# Patient Record
Sex: Female | Born: 1990 | Hispanic: No | Marital: Married | Smoking: Never smoker
Health system: Southern US, Community
[De-identification: ages and names within clinical notes are randomized; demographics above are authoritative.]

## PROBLEM LIST (undated history)

## (undated) ENCOUNTER — Inpatient Hospital Stay (HOSPITAL_COMMUNITY): Payer: Self-pay

## (undated) DIAGNOSIS — Z789 Other specified health status: Secondary | ICD-10-CM

## (undated) HISTORY — PX: NO PAST SURGERIES: SHX2092

---

## 2013-07-15 ENCOUNTER — Other Ambulatory Visit: Payer: Self-pay

## 2013-07-17 ENCOUNTER — Encounter: Payer: Self-pay | Admitting: Family Medicine

## 2013-07-17 ENCOUNTER — Other Ambulatory Visit (INDEPENDENT_AMBULATORY_CARE_PROVIDER_SITE_OTHER): Payer: Self-pay

## 2013-08-12 ENCOUNTER — Ambulatory Visit (INDEPENDENT_AMBULATORY_CARE_PROVIDER_SITE_OTHER): Payer: Medicaid Other | Admitting: Advanced Practice Midwife

## 2013-08-12 ENCOUNTER — Inpatient Hospital Stay (HOSPITAL_COMMUNITY)
Admission: AD | Admit: 2013-08-12 | Discharge: 2013-08-12 | Disposition: A | Discharge status: Home / Self Care | Payer: Medicaid Other | Source: Ambulatory Visit | Attending: Family Medicine | Admitting: Family Medicine

## 2013-08-12 ENCOUNTER — Encounter (HOSPITAL_COMMUNITY): Payer: Self-pay | Admitting: *Deleted

## 2013-08-12 ENCOUNTER — Encounter: Payer: Self-pay | Admitting: Advanced Practice Midwife

## 2013-08-12 ENCOUNTER — Ambulatory Visit (HOSPITAL_COMMUNITY)
Admission: RE | Admit: 2013-08-12 | Discharge: 2013-08-12 | Disposition: A | Discharge status: Home / Self Care | Payer: Medicaid Other | Source: Ambulatory Visit | Attending: Advanced Practice Midwife | Admitting: Advanced Practice Midwife

## 2013-08-12 ENCOUNTER — Other Ambulatory Visit (HOSPITAL_COMMUNITY): Payer: Self-pay | Admitting: *Deleted

## 2013-08-12 ENCOUNTER — Other Ambulatory Visit (HOSPITAL_COMMUNITY)
Admission: RE | Admit: 2013-08-12 | Discharge: 2013-08-12 | Disposition: A | Discharge status: Home / Self Care | Payer: Medicaid Other | Source: Ambulatory Visit | Attending: Advanced Practice Midwife | Admitting: Advanced Practice Midwife

## 2013-08-12 VITALS — BP 110/69 | Temp 97.7°F | Ht 62.0 in | Wt 117.4 lb

## 2013-08-12 DIAGNOSIS — O368390 Maternal care for abnormalities of the fetal heart rate or rhythm, unspecified trimester, not applicable or unspecified: Secondary | ICD-10-CM | POA: Insufficient documentation

## 2013-08-12 DIAGNOSIS — Z3400 Encounter for supervision of normal first pregnancy, unspecified trimester: Secondary | ICD-10-CM

## 2013-08-12 DIAGNOSIS — Z113 Encounter for screening for infections with a predominantly sexual mode of transmission: Secondary | ICD-10-CM | POA: Insufficient documentation

## 2013-08-12 DIAGNOSIS — Z3689 Encounter for other specified antenatal screening: Secondary | ICD-10-CM | POA: Insufficient documentation

## 2013-08-12 DIAGNOSIS — O3680X0 Pregnancy with inconclusive fetal viability, not applicable or unspecified: Secondary | ICD-10-CM | POA: Insufficient documentation

## 2013-08-12 DIAGNOSIS — O200 Threatened abortion: Secondary | ICD-10-CM

## 2013-08-12 DIAGNOSIS — Z01419 Encounter for gynecological examination (general) (routine) without abnormal findings: Secondary | ICD-10-CM | POA: Insufficient documentation

## 2013-08-12 DIAGNOSIS — O99891 Other specified diseases and conditions complicating pregnancy: Secondary | ICD-10-CM

## 2013-08-12 HISTORY — DX: Other specified health status: Z78.9

## 2013-08-12 LAB — POCT URINALYSIS DIP (DEVICE)
Ketones, ur: NEGATIVE mg/dL
Leukocytes, UA: NEGATIVE
Protein, ur: NEGATIVE mg/dL
pH: 6 (ref 5.0–8.0)

## 2013-08-12 LAB — HIV ANTIBODY (ROUTINE TESTING W REFLEX): HIV: NONREACTIVE

## 2013-08-12 MED ORDER — IBUPROFEN 800 MG PO TABS: 800.0000 mg | ORAL_TABLET | Freq: Three times a day (TID) | ORAL | Status: DC

## 2013-08-12 NOTE — Progress Notes (Signed)
Pulse- 97  Pain-on the sides at night Weight gain 25-35lbs New ob packet given

## 2013-08-12 NOTE — MAU Note (Signed)
Pt had routine appt in clinic with u/s, unable to obtain doppler FHT's, denies any bleeding or cramping.  Was sent to MAU from ultrasound. Thinks she is 11wks by LMP.

## 2013-08-12 NOTE — MAU Note (Signed)
Discussed results of u/s, expectant management and options if fetus does not pass on its own.  Called pt husband to be with her. Pt very tearful.

## 2013-08-12 NOTE — MAU Provider Note (Signed)
  History     CSN: [id number]  Arrival date and time: 08/12/13 1106   First Provider Initiated Contact with Patient 08/12/13 1235      No chief complaint on file.  HPI Comments: Wendy Ball 22 y.o. G1P0 was in Clinic today an at 9 weeks and 6 days pregnant when the nurses were unable to hear a fetal heart beat. She was sent to ultrasound and there was no cardiac activity visualized. CRL was 6 w 2d. The recommendations were to repeat ultrasound in 7-10 days.         Past Medical History  Diagnosis Date  . Medical history non-contributory     Past Surgical History  Procedure Laterality Date  . No past surgeries      History reviewed. No pertinent family history.  History  Substance Use Topics  . Smoking status: Never Smoker   . Smokeless tobacco: Never Used  . Alcohol Use: No    Allergies: No Known Allergies  No prescriptions prior to admission    Review of Systems  Constitutional: Negative.   HENT: Negative.   Eyes: Negative.   Respiratory: Negative.   Cardiovascular: Negative.   Gastrointestinal: Negative.   Genitourinary: Negative.   Musculoskeletal: Negative.   Skin: Negative.   Neurological: Negative.   Psychiatric/Behavioral: Negative.    Physical Exam   Blood pressure 103/67, pulse 83, temperature 97.6 F (36.4 C), temperature source Oral, resp. rate 16, last menstrual period 06/04/2013.  Physical Exam  Constitutional: She appears well-developed and well-nourished.  Cardiovascular: Normal rate.   Respiratory: Effort normal.  GI: Soft.  Genitourinary: Vagina normal and uterus normal. No vaginal discharge found.  Cervix is long and closed. Bimanual exam is negative. Uterus is nontender and difficult to determine size due to her position    MAU Course  Procedures  MDM U/S complete less than 14 weeks Spoke with Dr Shawnie Pons  Assessment and Plan  Pt given miscarriage precautions Repeat U/S in one week for size=dates and viability Motrin 800  mg po TID for pain in event of miscarriage only  Carolynn Serve 08/12/2013, 1:23 PM

## 2013-08-12 NOTE — Progress Notes (Signed)
   Subjective:    Wendy Ball is a G1P0 [redacted]w[redacted]d being seen today for her first obstetrical visit.  Her obstetrical history is significant for none. Patient does intend to breast feed. Pregnancy history fully reviewed.  Patient reports Some inguinal pain when lying in bed to sleep.  Ceasar Mons Vitals:   08/12/13 0847 08/12/13 0850  BP: 110/69   Temp: 97.7 F (36.5 C)   Height:  5\' 2"  (1.575 m)  Weight: 117 lb 6.4 oz (53.252 kg)     HISTORY: OB History  Gravida Para Term Preterm AB SAB TAB Ectopic Multiple Living  1             # Outcome Date GA Lbr Len/2nd Weight Sex Delivery Anes PTL Lv  1 CUR              History reviewed. No pertinent past medical history. History reviewed. No pertinent past surgical history. History reviewed. No pertinent family history.   Exam    Uterus:     Pelvic Exam:    Perineum: No Hemorrhoids, Normal Perineum   Vulva: normal   Vagina:  normal mucosa, normal discharge   pH:    Cervix: no cervical motion tenderness, no lesions and nulliparous appearance   Adnexa: normal adnexa and no mass, fullness, tenderness   Bony Pelvis: average  System: Breast:  normal appearance, no masses or tenderness   Skin: normal coloration and turgor, no rashes    Neurologic: oriented, normal, gait normal; reflexes normal and symmetric   Extremities: normal strength, tone, and muscle mass, ROM of all joints is normal   HEENT neck supple with midline trachea and thyroid without masses   Mouth/Teeth mucous membranes moist, pharynx normal without lesions and dental hygiene good   Neck supple   Cardiovascular: regular rate and rhythm   Respiratory:  appears well, vitals normal, no respiratory distress, acyanotic, normal RR, ear and throat exam is normal, neck free of mass or lymphadenopathy, chest clear, no wheezing, crepitations, rhonchi, normal symmetric air entry   Abdomen: soft, non-tender; bowel sounds normal; no masses,  no organomegaly   Urinary: urethral  meatus normal      Assessment:    Pregnancy: G1P0 Patient Active Problem List   Diagnosis Date Noted  . Supervision of normal first pregnancy 08/12/2013        Plan:     Initial labs drawn. Prenatal vitamins. Problem list reviewed and updated. Genetic Screening discussed First Screen: undecided.  Ultrasound discussed; fetal survey: requested.  Follow up in 4 weeks. 50% of 30 min visit spent on counseling and coordination of care.     LEFTWICH-KIRBY, Kasyn Rolph 08/12/2013

## 2013-08-12 NOTE — MAU Provider Note (Signed)
Chart reviewed and agree with management and plan.  

## 2013-08-13 LAB — OBSTETRIC PANEL
Antibody Screen: NEGATIVE
Eosinophils Absolute: 0.6 10*3/uL (ref 0.0–0.7)
Eosinophils Relative: 7 % — ABNORMAL HIGH (ref 0–5)
HCT: 36.1 % (ref 36.0–46.0)
Hemoglobin: 12.6 g/dL (ref 12.0–15.0)
Lymphocytes Relative: 17 % (ref 12–46)
Lymphs Abs: 1.5 10*3/uL (ref 0.7–4.0)
MCH: 30.4 pg (ref 26.0–34.0)
MCV: 87 fL (ref 78.0–100.0)
Monocytes Absolute: 0.6 10*3/uL (ref 0.1–1.0)
Monocytes Relative: 7 % (ref 3–12)
Platelets: 263 10*3/uL (ref 150–400)
RBC: 4.15 MIL/uL (ref 3.87–5.11)
Rh Type: POSITIVE
Rubella: 4.96 Index — ABNORMAL HIGH (ref <0.90)
WBC: 8.7 10*3/uL (ref 4.0–10.5)

## 2013-08-14 LAB — HEMOGLOBINOPATHY EVALUATION
Hgb A: 97.2 % (ref 96.8–97.8)
Hgb S Quant: 0 %

## 2013-08-20 ENCOUNTER — Inpatient Hospital Stay (HOSPITAL_COMMUNITY)
Admission: AD | Admit: 2013-08-20 | Discharge: 2013-08-21 | Disposition: A | Discharge status: Home / Self Care | Payer: Medicaid Other | Source: Ambulatory Visit | Attending: Obstetrics & Gynecology | Admitting: Obstetrics & Gynecology

## 2013-08-20 ENCOUNTER — Other Ambulatory Visit (HOSPITAL_COMMUNITY): Payer: Self-pay | Admitting: Family Medicine

## 2013-08-20 ENCOUNTER — Inpatient Hospital Stay (HOSPITAL_COMMUNITY)
Admission: AD | Admit: 2013-08-20 | Discharge: 2013-08-20 | Disposition: A | Discharge status: Home / Self Care | Payer: Medicaid Other | Source: Ambulatory Visit | Attending: Obstetrics & Gynecology | Admitting: Obstetrics & Gynecology

## 2013-08-20 ENCOUNTER — Ambulatory Visit (HOSPITAL_COMMUNITY)
Admit: 2013-08-20 | Discharge: 2013-08-20 | Disposition: A | Discharge status: Home / Self Care | Payer: Medicaid Other | Attending: Family Medicine | Admitting: Family Medicine

## 2013-08-20 ENCOUNTER — Encounter (HOSPITAL_COMMUNITY): Payer: Self-pay

## 2013-08-20 DIAGNOSIS — Z3689 Encounter for other specified antenatal screening: Secondary | ICD-10-CM | POA: Insufficient documentation

## 2013-08-20 DIAGNOSIS — O034 Incomplete spontaneous abortion without complication: Secondary | ICD-10-CM

## 2013-08-20 DIAGNOSIS — O365990 Maternal care for other known or suspected poor fetal growth, unspecified trimester, not applicable or unspecified: Secondary | ICD-10-CM | POA: Insufficient documentation

## 2013-08-20 DIAGNOSIS — N9489 Other specified conditions associated with female genital organs and menstrual cycle: Secondary | ICD-10-CM

## 2013-08-20 DIAGNOSIS — O3680X0 Pregnancy with inconclusive fetal viability, not applicable or unspecified: Secondary | ICD-10-CM

## 2013-08-20 DIAGNOSIS — R109 Unspecified abdominal pain: Secondary | ICD-10-CM | POA: Insufficient documentation

## 2013-08-20 DIAGNOSIS — O021 Missed abortion: Secondary | ICD-10-CM

## 2013-08-20 MED ORDER — PROMETHAZINE HCL 12.5 MG PO TABS: 12.5000 mg | ORAL_TABLET | Freq: Four times a day (QID) | ORAL | Status: DC | PRN

## 2013-08-20 MED ORDER — OXYCODONE-ACETAMINOPHEN 5-325 MG PO TABS: 1.0000 | ORAL_TABLET | ORAL | Status: DC | PRN

## 2013-08-20 MED ORDER — OXYCODONE-ACETAMINOPHEN 5-325 MG PO TABS: 1.0000 | ORAL_TABLET | Freq: Four times a day (QID) | ORAL | Status: DC | PRN

## 2013-08-20 MED ORDER — MISOPROSTOL 200 MCG PO TABS: 200.0000 ug | ORAL_TABLET | ORAL | Status: DC

## 2013-08-20 NOTE — MAU Provider Note (Signed)
Chief Complaint: Follow-up   None    SUBJECTIVE HPI: Wendy Ball is a 22 y.o. G1P0 at [redacted]w[redacted]d by LMP who presents forfollowup ultrasound. She was seen 1 week ago at 9 week 6 days by LMP and had ultrasound showing IUP with crown-rump length consistent with 6 weeks 2 days no cardiac activity. Denies vaginal bleeding or abdominal cramping. Bpos.   Past Medical History  Diagnosis Date  . Medical history non-contributory    OB History  Gravida Para Term Preterm AB SAB TAB Ectopic Multiple Living  1             # Outcome Date GA Lbr Len/2nd Weight Sex Delivery Anes PTL Lv  1 CUR              Past Surgical History  Procedure Laterality Date  . No past surgeries     History   Social History  . Marital Status: Married    Spouse Name: N/A    Number of Children: N/A  . Years of Education: N/A   Occupational History  . Not on file.   Social History Main Topics  . Smoking status: Never Smoker   . Smokeless tobacco: Never Used  . Alcohol Use: No  . Drug Use: No  . Sexual Activity: Yes     Comment: last intercourse one week ago   Other Topics Concern  . Not on file   Social History Narrative  . No narrative on file   No current facility-administered medications on file prior to encounter.   Current Outpatient Prescriptions on File Prior to Encounter  Medication Sig Dispense Refill  . ibuprofen (ADVIL,MOTRIN) 800 MG tablet Take 1 tablet (800 mg total) by mouth 3 (three) times daily.  21 tablet  0   No Known Allergies  ROS: Pertinent items in HPI  OBJECTIVE Blood pressure 95/54, pulse 86, temperature 98 F (36.7 C), temperature source Oral, resp. rate 16, last menstrual period 06/04/2013, SpO2 100.00%. GENERAL: Well-developed, well-nourished female in no acute distress.  ABDOMEN: Soft, non-tender  US Ob Transvaginal  08/20/2013   *RADIOLOGY REPORT*  Clinical Data: Size less than dates  TRANSVAGINAL OBSTETRIC US  Technique:  Transvaginal ultrasound was performed for  complete evaluation of the gestation as well as the maternal uterus, adnexal regions, and pelvic cul-de-sac.  Comparison:  08/12/2013  Intrauterine gestational sac: Visualized/irregular Yolk sac: Seen Embryo: Seen Cardiac Activity: Not seen Heart Rate: Not visualized  CRL: 4.6 mm           6   w  2   d        [place] EDC: 04/13/2014  Maternal uterus/adnexae: Both ovaries have a normal appearance with the right ovary measuring 2.4 x 2.4 x 1.7 cm and the left ovary measuring 1.6 x 1.5 x 1.7 cm.  No subchorionic hemorrhage, pelvic fluid or separate adnexal masses are seen.  IMPRESSION: Single intrauterine pregnancy demonstrating an estimated gestational age by crown-rump length of 6 weeks 2 days.  No interval growth has occurred of the fetal pole since the previous exam on 08/12/2013  which demonstrated a fetal pole measuring 5.6 mm and gestational age of 51w 2d. No interval development of fetal cardiac activity has occurred.  Findings today are confirmatory for pregnancy failure.  Normal ovaries   Original Report Authenticated By: Rhodia Albright, M.D.    MAU COURSE  ASSESSMENT 1. Embryonic demise     PLAN Offered optioins expectant managemnt, cytotec> couple elects cytotec Discharge home with bleeding precautions  Medication List    ASK your doctor about these medications       ibuprofen 800 MG tablet  Commonly known as:  ADVIL,MOTRIN  Take 1 tablet (800 mg total) by mouth 3 (three) times daily.            Early Intrauterine Pregnancy Failure  _x__  Documented intrauterine pregnancy failure less than or equal to [redacted] weeks gestation  _x_  No serious current illness  _x__  Baseline Hgb greater than or equal to 10g/dl  (29.5 )  _x__  Patient has easily accessible transportation to the hospital  _x__  Clear preference  _x__  Practitioner/physician deems patient reliable  _x__  Counseling by practitioner or physician  _x__  Patient education by RN  __x_  Consent form signed  _NI__   Rho-Gam given by RN if indicated  _x__ Medication dispensed   _x__   Cytotec 800 mcg  x_   Intravaginally by patient at home         __   Intravaginally by RN in MAU        __   Rectally by patient at home        __   Rectally by RN in MAU  _x__  Ibuprofen 600 mg 1 tablet by mouth every 6 hours as needed #30  _x__  Hydrocodone/acetaminophen 5/325 mg by mouth every 4 to 6 hours as needed  _x__  Phenergan 12.5 mg by mouth every 4 hours as needed for nausea   Follow-up Information   Follow up with [facility] In 2 weeks. (Someone from Clinic will call you with appointment)    Specialty:  Obstetrics and Gynecology   Contact information:   [address] [place] [address] [phone]      Danae Orleans, CNM 08/20/2013  10:26 AM

## 2013-08-20 NOTE — MAU Note (Signed)
Patient to MAU after follow up ultrasound. Patient denies bleeding but does have mild right abdominal pain off and on.

## 2013-08-20 NOTE — MAU Note (Addendum)
PT SAYS  SHE WAS HERE AT 0800-  HOME 1130.    TODAY CAME FOR FOLLOW-UP- ON U/S   NO FHR.   SO THEY SENT HER HOME WITH CYTOTEC.     SHE  INSERTED CYTOTEC AT 8PM AT HOME.   PAIN STARTED AT 830PM - ON RIGHT SIDE  THEN  AT 1030 PAIN  SWITCHED TO LEFT SIDE.   NO BLEEDING.     TOLD TO PICK UP RX-  THEY DID - WAS IBUPROFEN-     TOOK  800MG  AT 815PM.  .   NOW IN MAU- PAIN IS STILL ON LEFT SIDE.     SAYS PAIN IS SLIGHT LESS THAN  AT HOME    I CALLED [facility] ON  [address]-  SPOKE WITH PHARMACIST-   SHE SAID THEY PICKED  UP RX FOR CYTOTEC, IBUPROFEN, AND PHENERGAN-  BUT  NOTHING ON FILE ABOUT PERCOCET,   WHICH EPIC SAYS WAS PRINTED

## 2013-08-21 DIAGNOSIS — O034 Incomplete spontaneous abortion without complication: Secondary | ICD-10-CM

## 2013-08-21 MED ORDER — OXYCODONE-ACETAMINOPHEN 5-325 MG PO TABS
2.0000 | ORAL_TABLET | ORAL | Status: AC
  Administered 2013-08-21: 2 via ORAL
  Filled 2013-08-21: qty 2

## 2013-08-21 MED ORDER — OXYCODONE-ACETAMINOPHEN 5-325 MG PO TABS: 1.0000 | ORAL_TABLET | ORAL | Status: DC | PRN

## 2013-08-21 NOTE — MAU Provider Note (Signed)
Chief Complaint: Abdominal Pain   None    SUBJECTIVE HPI: Wendy Ball is a 22 y.o. G1P0 who presents to maternity admissions reporting abdominal pain.  She was prescribed Cytotec for a confirmed 6 week embryonic demise and placed the medication at 8 pm ~3 hours before her arrival in MAU.  She reports her pain is intermittent and severe, with pain in the R and L lower quadrants of her abdomen.  She picked up her prescription Ibuprofen and Phenergan but did not know her Percocet prescription was printed and did not obtain this medication.  She denies vaginal bleeding, vaginal itching/burning, urinary symptoms, h/a, dizziness, n/v, or fever/chills.    While in MAU, pt started to have vaginal bleeding, soaking 1/4 pad in 1 hour.    Past Medical History  Diagnosis Date  . Medical history non-contributory    Past Surgical History  Procedure Laterality Date  . No past surgeries     History   Social History  . Marital Status: Married    Spouse Name: N/A    Number of Children: N/A  . Years of Education: N/A   Occupational History  . Not on file.   Social History Main Topics  . Smoking status: Never Smoker   . Smokeless tobacco: Never Used  . Alcohol Use: No  . Drug Use: No  . Sexual Activity: Yes     Comment: last intercourse one week ago   Other Topics Concern  . Not on file   Social History Narrative  . No narrative on file   No current facility-administered medications on file prior to encounter.   Current Outpatient Prescriptions on File Prior to Encounter  Medication Sig Dispense Refill  . ibuprofen (ADVIL,MOTRIN) 800 MG tablet Take 1 tablet (800 mg total) by mouth 3 (three) times daily.  21 tablet  0  . misoprostol (CYTOTEC) 200 MCG tablet Take 1 tablet (200 mcg total) by mouth 1 day or 1 dose.  4 tablet  0  . oxyCODONE-acetaminophen (PERCOCET/ROXICET) 5-325 MG per tablet Take 1 tablet by mouth every 4 (four) hours as needed for pain.  10 tablet  0  .  oxyCODONE-acetaminophen (PERCOCET/ROXICET) 5-325 MG per tablet Take 1-2 tablets by mouth every 6 (six) hours as needed.  30 tablet  0  . promethazine (PHENERGAN) 12.5 MG tablet Take 1 tablet (12.5 mg total) by mouth every 6 (six) hours as needed for nausea.  30 tablet  0   No Known Allergies  ROS: Pertinent items in HPI  OBJECTIVE Blood pressure 92/50, pulse 78, last menstrual period 06/04/2013, unknown if currently breastfeeding. GENERAL: Well-developed, well-nourished female in no acute distress.  HEENT: Normocephalic HEART: normal rate RESP: normal effort ABDOMEN: Soft, non-tender EXTREMITIES: Nontender, no edema NEURO: Alert and oriented SPECULUM EXAM: Deferred  LAB RESULTS No results found for this or any previous visit (from the past 24 hour(s)).  IMAGING US Ob Comp Less 14 Wks  08/12/2013   OBSTETRICAL ULTRASOUND: This exam was performed within a [facility]. The OB US report was generated in the AS system, and faxed to the ordering physician.   This report is also available in [employer] and in the [employer]. See AS Obstetric US report.  US Ob Transvaginal  08/20/2013   *RADIOLOGY REPORT*  Clinical Data: Size less than dates  TRANSVAGINAL OBSTETRIC US  Technique:  Transvaginal ultrasound was performed for complete evaluation of the gestation as well as the maternal uterus, adnexal regions, and pelvic cul-de-sac.  Comparison:  08/12/2013  Intrauterine gestational sac: Visualized/irregular Yolk sac: Seen Embryo: Seen Cardiac Activity: Not seen Heart Rate: Not visualized  CRL: 4.6 mm           6   w  2   d        [place] EDC: 04/13/2014  Maternal uterus/adnexae: Both ovaries have a normal appearance with the right ovary measuring 2.4 x 2.4 x 1.7 cm and the left ovary measuring 1.6 x 1.5 x 1.7 cm.  No subchorionic hemorrhage, pelvic fluid or separate adnexal masses are seen.  IMPRESSION: Single intrauterine pregnancy demonstrating an estimated  gestational age by crown-rump length of 6 weeks 2 days.  No interval growth has occurred of the fetal pole since the previous exam on 08/12/2013  which demonstrated a fetal pole measuring 5.6 mm and gestational age of 44w 2d. No interval development of fetal cardiac activity has occurred.  Findings today are confirmatory for pregnancy failure.  Normal ovaries   Original Report Authenticated By: Rhodia Albright, M.D.   US Ob Transvaginal  08/12/2013   OBSTETRICAL ULTRASOUND: This exam was performed within a [facility] Department. The OB US report was generated in the AS system, and faxed to the ordering physician.   This report is also available in [employer] and in the [employer]. See AS Obstetric US report.   ASSESSMENT 1. Incomplete miscarriage   2. Uterine cramping     PLAN Percocet x2 given in MAU with significant improvement in symptoms Discharge home with bleeding precautions Printed new prescription for pt for Percocet x10 tabs F/U in [facility] as scheduled Return to MAU as needed   Sharen Counter Certified Nurse-Midwife 08/21/2013  12:21 AM

## 2013-08-26 ENCOUNTER — Encounter: Payer: Self-pay | Admitting: Obstetrics and Gynecology

## 2013-09-04 ENCOUNTER — Ambulatory Visit (INDEPENDENT_AMBULATORY_CARE_PROVIDER_SITE_OTHER): Payer: Medicaid Other | Admitting: Advanced Practice Midwife

## 2013-09-04 VITALS — BP 98/69 | HR 82 | Temp 98.8°F | Wt 115.0 lb

## 2013-09-04 DIAGNOSIS — O021 Missed abortion: Secondary | ICD-10-CM

## 2013-09-06 DIAGNOSIS — O021 Missed abortion: Secondary | ICD-10-CM | POA: Insufficient documentation

## 2013-09-06 NOTE — Patient Instructions (Signed)
Miscarriage A miscarriage is the sudden loss of an unborn baby (fetus) before the 20th week of pregnancy. Most miscarriages happen in the first 3 months of pregnancy. Sometimes, it happens before a woman even knows she is pregnant. A miscarriage is also called a "spontaneous miscarriage" or "early pregnancy loss." Having a miscarriage can be an emotional experience. Talk with your caregiver about any questions you may have about miscarrying, the grieving process, and your future pregnancy plans. CAUSES   Problems with the fetal chromosomes that make it impossible for the baby to develop normally. Problems with the baby's genes or chromosomes are most often the result of errors that occur, by chance, as the embryo divides and grows. The problems are not inherited from the parents.  Infection of the cervix or uterus.   Hormone problems.   Problems with the cervix, such as having an incompetent cervix. This is when the tissue in the cervix is not strong enough to hold the pregnancy.   Problems with the uterus, such as an abnormally shaped uterus, uterine fibroids, or congenital abnormalities.   Certain medical conditions.   Smoking, drinking alcohol, or taking illegal drugs.   Trauma.  Often, the cause of a miscarriage is unknown.  SYMPTOMS   Vaginal bleeding or spotting, with or without cramps or pain.  Pain or cramping in the abdomen or lower back.  Passing fluid, tissue, or blood clots from the vagina. DIAGNOSIS  Your caregiver will perform a physical exam. You may also have an ultrasound to confirm the miscarriage. Blood or urine tests may also be ordered. TREATMENT   Sometimes, treatment is not necessary if you naturally pass all the fetal tissue that was in the uterus. If some of the fetus or placenta remains in the body (incomplete miscarriage), tissue left behind may become infected and must be removed. Usually, a dilation and curettage (D and C) procedure is performed.  During a D and C procedure, the cervix is widened (dilated) and any remaining fetal or placental tissue is gently removed from the uterus.  Antibiotic medicines are prescribed if there is an infection. Other medicines may be given to reduce the size of the uterus (contract) if there is a lot of bleeding.  If you have Rh negative blood and your baby was Rh positive, you will need a Rh immunoglobulin shot. This shot will protect any future baby from having Rh blood problems in future pregnancies. HOME CARE INSTRUCTIONS   Your caregiver may order bed rest or may allow you to continue light activity. Resume activity as directed by your caregiver.  Have someone help with home and family responsibilities during this time.   Keep track of the number of sanitary pads you use each day and how soaked (saturated) they are. Write down this information.   Do not use tampons. Do not douche or have sexual intercourse until approved by your caregiver.   Only take over-the-counter or prescription medicines for pain or discomfort as directed by your caregiver.   Do not take aspirin. Aspirin can cause bleeding.   Keep all follow-up appointments with your caregiver.   If you or your partner have problems with grieving, talk to your caregiver or seek counseling to help cope with the pregnancy loss. Allow enough time to grieve before trying to get pregnant again.  SEEK IMMEDIATE MEDICAL CARE IF:   You have severe cramps or pain in your back or abdomen.  You have a fever.  You pass large blood clots (walnut-sized   or larger) ortissue from your vagina. Save any tissue for your caregiver to inspect.   Your bleeding increases.   You have a thick, bad-smelling vaginal discharge.  You become lightheaded, weak, or you faint.   You have chills.  MAKE SURE YOU:  Understand these instructions.  Will watch your condition.  Will get help right away if you are not doing well or get  worse. Document Released: 05/29/2001 Document Revised: 06/03/2012 Document Reviewed: 01/22/2012 [employer] Patient Information 2014 [employer].  

## 2013-09-06 NOTE — Progress Notes (Signed)
  Subjective:    Patient ID: Wendy Ball, female    DOB: 07-20-1991, 22 y.o.   MRN: [id number]  HPI This is a 22 y.o. female who is about 2 weeks post cytotec for a missed abortion.  She has done well since then. Had bleeding for a few days then stopped. No pain now.    Review of Systems  Constitutional: Negative for activity change and fatigue.  Cardiovascular: Negative for chest pain.  Genitourinary: Negative for vaginal bleeding, difficulty urinating and menstrual problem.  Neurological: Negative for dizziness.       Objective:   Physical Exam  Constitutional: She is oriented to person, place, and time. She appears well-developed and well-nourished. No distress.  Cardiovascular: Normal rate.   Pulmonary/Chest: Effort normal.  Abdominal: Soft. She exhibits no distension and no mass. There is no tenderness. There is no rebound and no guarding.  Genitourinary: Vagina normal and uterus normal. No vaginal discharge found.  Musculoskeletal: Normal range of motion.  Neurological: She is alert and oriented to person, place, and time.  Skin: Skin is warm and dry.  Psychiatric: She has a normal mood and affect.          Assessment & Plan:  A:  Post missed abortion, s/p cytotec   P:  Discussed miscarriage      May want to try again, discussed timing and recommend continuing PNVs.      Return as needed

## 2013-09-11 ENCOUNTER — Encounter: Payer: Medicaid Other | Admitting: Family

## 2014-10-18 ENCOUNTER — Encounter (HOSPITAL_COMMUNITY): Payer: Self-pay

## 2014-12-13 ENCOUNTER — Ambulatory Visit: Payer: Medicaid Other

## 2014-12-13 ENCOUNTER — Other Ambulatory Visit: Payer: Self-pay | Admitting: *Deleted

## 2014-12-13 DIAGNOSIS — Z3490 Encounter for supervision of normal pregnancy, unspecified, unspecified trimester: Secondary | ICD-10-CM

## 2014-12-13 NOTE — Progress Notes (Unsigned)
[facility] States that her last period was around 10/23/14. Will schedule ultrasound for viability and dating.

## 2014-12-17 NOTE — L&D Delivery Note (Cosign Needed)
.  Delivery Note At 6:45 PM a viable female was delivered via Vaginal, Spontaneous Delivery (Presentation: Right Occiput Anterior).  APGAR: 3, 6; weight 7 lb 13.2 oz (3549 g).   Placenta status: Intact, Spontaneous.  Cord: 3 vessels with the following complications: prolonged decels at end of pushing (pushed for 2 hours). Modified ritgen maneuver. Anterior shoulder rotation to deliver anterior shoulder; no dystocia.  Cord pH: sent  Anesthesia: Epidural  Episiotomy: None Lacerations: 2nd degree;Perineal Suture Repair: 3.0 vicryl Est. Blood Loss (mL): 400 nl  Mom to postpartum.  Baby to NICU given oxygen requirement.  Silvano Bilis 08/17/2015, 7:39 PM

## 2014-12-21 ENCOUNTER — Other Ambulatory Visit: Payer: Self-pay | Admitting: Family Medicine

## 2014-12-21 ENCOUNTER — Ambulatory Visit (HOSPITAL_COMMUNITY)
Admission: RE | Admit: 2014-12-21 | Discharge: 2014-12-21 | Disposition: A | Discharge status: Home / Self Care | Payer: Medicaid Other | Source: Ambulatory Visit | Attending: Family Medicine | Admitting: Family Medicine

## 2014-12-21 DIAGNOSIS — N831 Corpus luteum cyst: Secondary | ICD-10-CM | POA: Diagnosis not present

## 2014-12-21 DIAGNOSIS — Z3A01 Less than 8 weeks gestation of pregnancy: Secondary | ICD-10-CM | POA: Diagnosis not present

## 2014-12-21 DIAGNOSIS — Z36 Encounter for antenatal screening of mother: Secondary | ICD-10-CM | POA: Diagnosis present

## 2014-12-21 DIAGNOSIS — O3481 Maternal care for other abnormalities of pelvic organs, first trimester: Secondary | ICD-10-CM | POA: Insufficient documentation

## 2014-12-21 DIAGNOSIS — O208 Other hemorrhage in early pregnancy: Secondary | ICD-10-CM | POA: Insufficient documentation

## 2014-12-21 DIAGNOSIS — Z3490 Encounter for supervision of normal pregnancy, unspecified, unspecified trimester: Secondary | ICD-10-CM

## 2014-12-22 ENCOUNTER — Telehealth: Payer: Self-pay | Admitting: General Practice

## 2014-12-22 DIAGNOSIS — O3680X1 Pregnancy with inconclusive fetal viability, fetus 1: Secondary | ICD-10-CM

## 2014-12-22 NOTE — Telephone Encounter (Signed)
Patient called and left message stating she had an ultrasound done yesterday and she would like her results. Spoke to Dr Erin FullingHarraway-Smith who recommended follow up ultrasound in 2 weeks to ensure normal progressing pregnancy. Called patient, no answer- left message stating we calling to return your phone call, please call us back at the clinics.

## 2014-12-28 NOTE — Telephone Encounter (Signed)
Called pt and left message stating that I am calling back to answer her question and provide additional information. Please call back and state whether a detailed message can be left on her voice mail. Per previous note from Marylynn Pearsonarrie Hillman, RN pt needs follow up US to ensure normal pregnancy progression prior to clinic visit on 1/25.  US scheduled on 1/19 @ 1115.

## 2014-12-29 ENCOUNTER — Telehealth: Payer: Self-pay | Admitting: General Practice

## 2014-12-29 NOTE — Telephone Encounter (Signed)
Opened in error

## 2014-12-29 NOTE — Telephone Encounter (Signed)
Patient called and left message stating only her name and birthday. Called patient back and she asked about ultrasound results. Informed patient of ultrasound results and recommended follow up to ensure normal progressing pregnancy. Patient verbalized understanding. Told patient of ultrasound scheduled for 1/19. Patient verbalized understanding and asked about rescheduling ultrasound for a Friday. Provided patient with ultrasound's phone number to call and reschedule. Patient verbalized understanding and confirmed new OB appt on 1/25. Told patient that was correct and her ultrasound should be before then. Patient verbalized understanding and had no questions. Ultrasound rescheduled for 1/22 @ 1:45.

## 2015-01-04 ENCOUNTER — Ambulatory Visit (HOSPITAL_COMMUNITY): Payer: Medicaid Other

## 2015-01-07 ENCOUNTER — Ambulatory Visit (HOSPITAL_COMMUNITY): Admission: RE | Admit: 2015-01-07 | Payer: Medicaid Other | Source: Ambulatory Visit

## 2015-01-10 ENCOUNTER — Other Ambulatory Visit (HOSPITAL_COMMUNITY)
Admission: RE | Admit: 2015-01-10 | Discharge: 2015-01-10 | Disposition: A | Discharge status: Home / Self Care | Payer: Medicaid Other | Source: Ambulatory Visit | Attending: Obstetrics & Gynecology | Admitting: Obstetrics & Gynecology

## 2015-01-10 ENCOUNTER — Ambulatory Visit (INDEPENDENT_AMBULATORY_CARE_PROVIDER_SITE_OTHER): Payer: Medicaid Other | Admitting: Obstetrics & Gynecology

## 2015-01-10 ENCOUNTER — Encounter: Payer: Self-pay | Admitting: Obstetrics & Gynecology

## 2015-01-10 VITALS — BP 112/63 | HR 77 | Temp 97.3°F | Wt 109.5 lb

## 2015-01-10 DIAGNOSIS — Z3401 Encounter for supervision of normal first pregnancy, first trimester: Secondary | ICD-10-CM

## 2015-01-10 DIAGNOSIS — Z118 Encounter for screening for other infectious and parasitic diseases: Secondary | ICD-10-CM

## 2015-01-10 DIAGNOSIS — Z01419 Encounter for gynecological examination (general) (routine) without abnormal findings: Secondary | ICD-10-CM | POA: Insufficient documentation

## 2015-01-10 DIAGNOSIS — Z113 Encounter for screening for infections with a predominantly sexual mode of transmission: Secondary | ICD-10-CM | POA: Insufficient documentation

## 2015-01-10 DIAGNOSIS — O3680X1 Pregnancy with inconclusive fetal viability, fetus 1: Secondary | ICD-10-CM

## 2015-01-10 DIAGNOSIS — Z124 Encounter for screening for malignant neoplasm of cervix: Secondary | ICD-10-CM

## 2015-01-10 LAB — US OB COMP LESS 14 WKS

## 2015-01-10 LAB — POCT URINALYSIS DIP (DEVICE)
BILIRUBIN URINE: NEGATIVE
GLUCOSE, UA: NEGATIVE mg/dL
Ketones, ur: NEGATIVE mg/dL
Nitrite: NEGATIVE
PROTEIN: NEGATIVE mg/dL
Specific Gravity, Urine: 1.015 (ref 1.005–1.030)
Urobilinogen, UA: 0.2 mg/dL (ref 0.0–1.0)
pH: 7 (ref 5.0–8.0)

## 2015-01-10 NOTE — Progress Notes (Signed)
    Subjective:    Wendy Ball is a 24 y.o. G2P0010 at 6330w5d by early u/s being seen today for her first obstetrical visit.  Her obstetrical history is significant for one SAB. Patient does not intend to breast feed. Pregnancy history fully reviewed.  Patient reports right RLP pain; she was reassured about this. Ceasar Mons.  Filed Vitals:   01/10/15 1056  BP: 112/63  Pulse: 77  Temp: 97.3 F (36.3 C)  Weight: 109 lb 8 oz (49.669 kg)    HISTORY: OB History  Gravida Para Term Preterm AB SAB TAB Ectopic Multiple Living  2 0 0 0 1 1 0 0 0 0     # Outcome Date GA Lbr Len/2nd Weight Sex Delivery Anes PTL Lv  2 Current           1 SAB              Past Medical History  Diagnosis Date  . Medical history non-contributory    Past Surgical History  Procedure Laterality Date  . No past surgeries     History reviewed. No pertinent family history.   Exam    Uterus:     Pelvic Exam:    Perineum: No Hemorrhoids, Normal Perineum   Vulva: normal   Vagina:  normal mucosa, normal discharge   Cervix: no bleeding following Pap, no cervical motion tenderness, no lesions and nulliparous appearance   Adnexa: normal adnexa and no mass, fullness, tenderness   Bony Pelvis: average  System: Breast:  normal appearance, no masses or tenderness, Inspection negative   Skin: normal coloration and turgor, no rashes   Neurologic: oriented, normal   Extremities: normal strength, tone, and muscle mass   HEENT PERRLA and extra ocular movement intact   Mouth/Teeth mucous membranes moist, pharynx normal without lesions and dental hygiene good   Neck supple and no masses   Cardiovascular: regular rate and rhythm   Respiratory:  appears well, vitals normal, no respiratory distress, acyanotic, normal RR, chest clear, no wheezing, crepitations, rhonchi, normal symmetric air entry   Abdomen: soft, non-tender; bowel sounds normal; no masses,  no organomegaly   Urinary: urethral meatus normal   FHR not  auscultated by doppler, limited OB U/S in clinic showed: viable IUP with HR of 175.   Assessment:    Pregnancy: G2P0010 Patient Active Problem List   Diagnosis Date Noted  . Supervision of normal first pregnancy 08/12/2013     Plan:   Initial labs drawn. Pap done today.  Will follow up all results and manage accordingly. Continue prenatal vitamins. Problem list reviewed and updated. Genetic Screening discussed First Screen: ordered. Ultrasound discussed; fetal survey: to be ordered later. Follow up in 4 weeks. Routine obstetric precautions reviewed. The [facility] with multiple MDs and other Advanced Practitioners was explained to patient; also emphasized that residents, students are part of our team.   Tereso NewcomerANYANWU,Amery Minasyan A, MD 01/10/2015

## 2015-01-10 NOTE — Patient Instructions (Signed)
First Trimester of Pregnancy The first trimester of pregnancy is from week 1 until the end of week 12 (months 1 through 3). A week after a sperm fertilizes an egg, the egg will implant on the wall of the uterus. This embryo will begin to develop into a baby. Genes from you and your partner are forming the baby. The female genes determine whether the baby is a boy or a girl. At 6-8 weeks, the eyes and face are formed, and the heartbeat can be seen on ultrasound. At the end of 12 weeks, all the baby's organs are formed.  Now that you are pregnant, you will want to do everything you can to have a healthy baby. Two of the most important things are to get good prenatal care and to follow your health care provider's instructions. Prenatal care is all the medical care you receive before the baby's birth. This care will help prevent, find, and treat any problems during the pregnancy and childbirth. BODY CHANGES Your body goes through many changes during pregnancy. The changes vary from woman to woman.   You may gain or lose a couple of pounds at first.  You may feel sick to your stomach (nauseous) and throw up (vomit). If the vomiting is uncontrollable, call your health care provider.  You may tire easily.  You may develop headaches that can be relieved by medicines approved by your health care provider.  You may urinate more often. Painful urination may mean you have a bladder infection.  You may develop heartburn as a result of your pregnancy.  You may develop constipation because certain hormones are causing the muscles that push waste through your intestines to slow down.  You may develop hemorrhoids or swollen, bulging veins (varicose veins).  Your breasts may begin to grow larger and become tender. Your nipples may stick out more, and the tissue that surrounds them (areola) may become darker.  Your gums may bleed and may be sensitive to brushing and flossing.  Dark spots or blotches (chloasma,  mask of pregnancy) may develop on your face. This will likely fade after the baby is born.  Your menstrual periods will stop.  You may have a loss of appetite.  You may develop cravings for certain kinds of food.  You may have changes in your emotions from day to day, such as being excited to be pregnant or being concerned that something may go wrong with the pregnancy and baby.  You may have more vivid and strange dreams.  You may have changes in your hair. These can include thickening of your hair, rapid growth, and changes in texture. Some women also have hair loss during or after pregnancy, or hair that feels dry or thin. Your hair will most likely return to normal after your baby is born. WHAT TO EXPECT AT YOUR PRENATAL VISITS During a routine prenatal visit:  You will be weighed to make sure you and the baby are growing normally.  Your blood pressure will be taken.  Your abdomen will be measured to track your baby's growth.  The fetal heartbeat will be listened to starting around week 10 or 12 of your pregnancy.  Test results from any previous visits will be discussed. Your health care provider may ask you:  How you are feeling.  If you are feeling the baby move.  If you have had any abnormal symptoms, such as leaking fluid, bleeding, severe headaches, or abdominal cramping.  If you have any questions. Other tests   that may be performed during your first trimester include:  Blood tests to find your blood type and to check for the presence of any previous infections. They will also be used to check for low iron levels (anemia) and Rh antibodies. Later in the pregnancy, blood tests for diabetes will be done along with other tests if problems develop.  Urine tests to check for infections, diabetes, or protein in the urine.  An ultrasound to confirm the proper growth and development of the baby.  An amniocentesis to check for possible genetic problems.  Fetal screens for  spina bifida and Down syndrome.  You may need other tests to make sure you and the baby are doing well. HOME CARE INSTRUCTIONS  Medicines  Follow your health care provider's instructions regarding medicine use. Specific medicines may be either safe or unsafe to take during pregnancy.  Take your prenatal vitamins as directed.  If you develop constipation, try taking a stool softener if your health care provider approves. Diet  Eat regular, well-balanced meals. Choose a variety of foods, such as meat or vegetable-based protein, fish, milk and low-fat dairy products, vegetables, fruits, and whole grain breads and cereals. Your health care provider will help you determine the amount of weight gain that is right for you.  Avoid raw meat and uncooked cheese. These carry germs that can cause birth defects in the baby.  Eating four or five small meals rather than three large meals a day may help relieve nausea and vomiting. If you start to feel nauseous, eating a few soda crackers can be helpful. Drinking liquids between meals instead of during meals also seems to help nausea and vomiting.  If you develop constipation, eat more high-fiber foods, such as fresh vegetables or fruit and whole grains. Drink enough fluids to keep your urine clear or pale yellow. Activity and Exercise  Exercise only as directed by your health care provider. Exercising will help you:  Control your weight.  Stay in shape.  Be prepared for labor and delivery.  Experiencing pain or cramping in the lower abdomen or low back is a good sign that you should stop exercising. Check with your health care provider before continuing normal exercises.  Try to avoid standing for long periods of time. Move your legs often if you must stand in one place for a long time.  Avoid heavy lifting.  Wear low-heeled shoes, and practice good posture.  You may continue to have sex unless your health care provider directs you  otherwise. Relief of Pain or Discomfort  Wear a good support bra for breast tenderness.   Take warm sitz baths to soothe any pain or discomfort caused by hemorrhoids. Use hemorrhoid cream if your health care provider approves.   Rest with your legs elevated if you have leg cramps or low back pain.  If you develop varicose veins in your legs, wear support hose. Elevate your feet for 15 minutes, 3-4 times a day. Limit salt in your diet. Prenatal Care  Schedule your prenatal visits by the twelfth week of pregnancy. They are usually scheduled monthly at first, then more often in the last 2 months before delivery.  Write down your questions. Take them to your prenatal visits.  Keep all your prenatal visits as directed by your health care provider. Safety  Wear your seat belt at all times when driving.  Make a list of emergency phone numbers, including numbers for family, friends, the hospital, and police and fire departments. General Tips    Ask your health care provider for a referral to a local prenatal education class. Begin classes no later than at the beginning of month 6 of your pregnancy.  Ask for help if you have counseling or nutritional needs during pregnancy. Your health care provider can offer advice or refer you to specialists for help with various needs.  Do not use hot tubs, steam rooms, or saunas.  Do not douche or use tampons or scented sanitary pads.  Do not cross your legs for long periods of time.  Avoid cat litter boxes and soil used by cats. These carry germs that can cause birth defects in the baby and possibly loss of the fetus by miscarriage or stillbirth.  Avoid all smoking, herbs, alcohol, and medicines not prescribed by your health care provider. Chemicals in these affect the formation and growth of the baby.  Schedule a dentist appointment. At home, brush your teeth with a soft toothbrush and be gentle when you floss. SEEK MEDICAL CARE IF:   You have  dizziness.  You have mild pelvic cramps, pelvic pressure, or nagging pain in the abdominal area.  You have persistent nausea, vomiting, or diarrhea.  You have a bad smelling vaginal discharge.  You have pain with urination.  You notice increased swelling in your face, hands, legs, or ankles. SEEK IMMEDIATE MEDICAL CARE IF:   You have a fever.  You are leaking fluid from your vagina.  You have spotting or bleeding from your vagina.  You have severe abdominal cramping or pain.  You have rapid weight gain or loss.  You vomit blood or material that looks like coffee grounds.  You are exposed to German measles and have never had them.  You are exposed to fifth disease or chickenpox.  You develop a severe headache.  You have shortness of breath.  You have any kind of trauma, such as from a fall or a car accident. Document Released: 11/27/2001 Document Revised: 04/19/2014 Document Reviewed: 10/13/2013 ExitCare Patient Information 2015 [employer]. This information is not intended to replace advice given to you by your health care provider. Make sure you discuss any questions you have with your health care provider.  

## 2015-01-10 NOTE — Progress Notes (Signed)
First Screen 02/07/15 @ 8a with [facility].

## 2015-01-10 NOTE — Progress Notes (Signed)
C/o of mild, occasional right sided pain especially at night.  New OB packet given. Initial labs today.

## 2015-01-10 NOTE — Progress Notes (Signed)
Bedside US for viability = single IUP, FHR = 175 per [facility] doppler

## 2015-01-11 LAB — PRENATAL PROFILE (SOLSTAS)
ANTIBODY SCREEN: NEGATIVE
BASOS PCT: 0 % (ref 0–1)
Basophils Absolute: 0 10*3/uL (ref 0.0–0.1)
EOS ABS: 0.3 10*3/uL (ref 0.0–0.7)
EOS PCT: 3 % (ref 0–5)
HEMATOCRIT: 37.9 % (ref 36.0–46.0)
HIV: NONREACTIVE
Hemoglobin: 12.8 g/dL (ref 12.0–15.0)
Hepatitis B Surface Ag: NEGATIVE
LYMPHS PCT: 15 % (ref 12–46)
Lymphs Abs: 1.4 10*3/uL (ref 0.7–4.0)
MCH: 30.3 pg (ref 26.0–34.0)
MCHC: 33.8 g/dL (ref 30.0–36.0)
MCV: 89.8 fL (ref 78.0–100.0)
MONO ABS: 0.6 10*3/uL (ref 0.1–1.0)
MPV: 10.5 fL (ref 8.6–12.4)
Monocytes Relative: 6 % (ref 3–12)
NEUTROS ABS: 7.3 10*3/uL (ref 1.7–7.7)
Neutrophils Relative %: 76 % (ref 43–77)
Platelets: 264 10*3/uL (ref 150–400)
RBC: 4.22 MIL/uL (ref 3.87–5.11)
RDW: 14.4 % (ref 11.5–15.5)
RH TYPE: POSITIVE
Rubella: 4.4 Index — ABNORMAL HIGH (ref <0.90)
WBC: 9.6 10*3/uL (ref 4.0–10.5)

## 2015-01-11 LAB — PRESCRIPTION MONITORING PROFILE (19 PANEL)
AMPHETAMINE/METH: NEGATIVE ng/mL
BARBITURATE SCREEN, URINE: NEGATIVE ng/mL
BENZODIAZEPINE SCREEN, URINE: NEGATIVE ng/mL
Buprenorphine, Urine: NEGATIVE ng/mL
Cannabinoid Scrn, Ur: NEGATIVE ng/mL
Carisoprodol, Urine: NEGATIVE ng/mL
Cocaine Metabolites: NEGATIVE ng/mL
Creatinine, Urine: 43.45 mg/dL (ref >20.0)
Fentanyl, Ur: NEGATIVE ng/mL
MDMA URINE: NEGATIVE ng/mL
MEPERIDINE UR: NEGATIVE ng/mL
Methadone Screen, Urine: NEGATIVE ng/mL
Methaqualone: NEGATIVE ng/mL
Nitrites, Initial: NEGATIVE ug/mL
OPIATE SCREEN, URINE: NEGATIVE ng/mL
Oxycodone Screen, Ur: NEGATIVE ng/mL
PHENCYCLIDINE, UR: NEGATIVE ng/mL
Propoxyphene: NEGATIVE ng/mL
Tapentadol, urine: NEGATIVE ng/mL
Tramadol Scrn, Ur: NEGATIVE ng/mL
Zolpidem, Urine: NEGATIVE ng/mL
pH, Initial: 7.3 pH (ref 4.5–8.9)

## 2015-01-11 LAB — CYTOLOGY - PAP

## 2015-01-12 LAB — CULTURE, OB URINE

## 2015-01-17 ENCOUNTER — Encounter: Payer: Self-pay | Admitting: Obstetrics & Gynecology

## 2015-02-04 ENCOUNTER — Ambulatory Visit (HOSPITAL_COMMUNITY): Payer: Medicaid Other

## 2015-02-07 ENCOUNTER — Ambulatory Visit (HOSPITAL_COMMUNITY): Payer: Medicaid Other

## 2015-02-08 ENCOUNTER — Encounter: Payer: Self-pay | Admitting: Advanced Practice Midwife

## 2015-02-08 ENCOUNTER — Ambulatory Visit (HOSPITAL_COMMUNITY)
Admission: RE | Admit: 2015-02-08 | Discharge: 2015-02-08 | Disposition: A | Discharge status: Home / Self Care | Payer: Medicaid Other | Source: Ambulatory Visit | Attending: Obstetrics & Gynecology | Admitting: Obstetrics & Gynecology

## 2015-02-08 ENCOUNTER — Encounter (HOSPITAL_COMMUNITY): Payer: Self-pay

## 2015-02-08 ENCOUNTER — Ambulatory Visit (INDEPENDENT_AMBULATORY_CARE_PROVIDER_SITE_OTHER): Payer: Medicaid Other | Admitting: Advanced Practice Midwife

## 2015-02-08 VITALS — BP 109/66 | HR 85 | Temp 97.8°F | Wt 106.8 lb

## 2015-02-08 DIAGNOSIS — Z3682 Encounter for antenatal screening for nuchal translucency: Secondary | ICD-10-CM | POA: Insufficient documentation

## 2015-02-08 DIAGNOSIS — Z3402 Encounter for supervision of normal first pregnancy, second trimester: Secondary | ICD-10-CM

## 2015-02-08 DIAGNOSIS — Z3401 Encounter for supervision of normal first pregnancy, first trimester: Secondary | ICD-10-CM

## 2015-02-08 DIAGNOSIS — Z3A12 12 weeks gestation of pregnancy: Secondary | ICD-10-CM | POA: Insufficient documentation

## 2015-02-08 LAB — POCT URINALYSIS DIP (DEVICE)
BILIRUBIN URINE: NEGATIVE
Glucose, UA: NEGATIVE mg/dL
KETONES UR: NEGATIVE mg/dL
LEUKOCYTES UA: NEGATIVE
NITRITE: NEGATIVE
PROTEIN: NEGATIVE mg/dL
Specific Gravity, Urine: 1.015 (ref 1.005–1.030)
Urobilinogen, UA: 0.2 mg/dL (ref 0.0–1.0)
pH: 7 (ref 5.0–8.0)

## 2015-02-08 NOTE — Progress Notes (Signed)
C/o N/V

## 2015-02-08 NOTE — Patient Instructions (Signed)
Second Trimester of Pregnancy The second trimester is from week 13 through week 28, months 4 through 6. The second trimester is often a time when you feel your best. Your body has also adjusted to being pregnant, and you begin to feel better physically. Usually, morning sickness has lessened or quit completely, you may have more energy, and you may have an increase in appetite. The second trimester is also a time when the fetus is growing rapidly. At the end of the sixth month, the fetus is about 9 inches long and weighs about 1 pounds. You will likely begin to feel the baby move (quickening) between 18 and 20 weeks of the pregnancy. BODY CHANGES Your body goes through many changes during pregnancy. The changes vary from woman to woman.   Your weight will continue to increase. You will notice your lower abdomen bulging out.  You may begin to get stretch marks on your hips, abdomen, and breasts.  You may develop headaches that can be relieved by medicines approved by your health care provider.  You may urinate more often because the fetus is pressing on your bladder.  You may develop or continue to have heartburn as a result of your pregnancy.  You may develop constipation because certain hormones are causing the muscles that push waste through your intestines to slow down.  You may develop hemorrhoids or swollen, bulging veins (varicose veins).  You may have back pain because of the weight gain and pregnancy hormones relaxing your joints between the bones in your pelvis and as a result of a shift in weight and the muscles that support your balance.  Your breasts will continue to grow and be tender.  Your gums may bleed and may be sensitive to brushing and flossing.  Dark spots or blotches (chloasma, mask of pregnancy) may develop on your face. This will likely fade after the baby is born.  A dark line from your belly button to the pubic area (linea nigra) may appear. This will likely fade  after the baby is born.  You may have changes in your hair. These can include thickening of your hair, rapid growth, and changes in texture. Some women also have hair loss during or after pregnancy, or hair that feels dry or thin. Your hair will most likely return to normal after your baby is born. WHAT TO EXPECT AT YOUR PRENATAL VISITS During a routine prenatal visit:  You will be weighed to make sure you and the fetus are growing normally.  Your blood pressure will be taken.  Your abdomen will be measured to track your baby's growth.  The fetal heartbeat will be listened to.  Any test results from the previous visit will be discussed. Your health care provider may ask you:  How you are feeling.  If you are feeling the baby move.  If you have had any abnormal symptoms, such as leaking fluid, bleeding, severe headaches, or abdominal cramping.  If you have any questions. Other tests that may be performed during your second trimester include:  Blood tests that check for:  Low iron levels (anemia).  Gestational diabetes (between 24 and 28 weeks).  Rh antibodies.  Urine tests to check for infections, diabetes, or protein in the urine.  An ultrasound to confirm the proper growth and development of the baby.  An amniocentesis to check for possible genetic problems.  Fetal screens for spina bifida and Down syndrome. HOME CARE INSTRUCTIONS   Avoid all smoking, herbs, alcohol, and unprescribed   drugs. These chemicals affect the formation and growth of the baby.  Follow your health care provider's instructions regarding medicine use. There are medicines that are either safe or unsafe to take during pregnancy.  Exercise only as directed by your health care provider. Experiencing uterine cramps is a good sign to stop exercising.  Continue to eat regular, healthy meals.  Wear a good support bra for breast tenderness.  Do not use hot tubs, steam rooms, or saunas.  Wear your  seat belt at all times when driving.  Avoid raw meat, uncooked cheese, cat litter boxes, and soil used by cats. These carry germs that can cause birth defects in the baby.  Take your prenatal vitamins.  Try taking a stool softener (if your health care provider approves) if you develop constipation. Eat more high-fiber foods, such as fresh vegetables or fruit and whole grains. Drink plenty of fluids to keep your urine clear or pale yellow.  Take warm sitz baths to soothe any pain or discomfort caused by hemorrhoids. Use hemorrhoid cream if your health care provider approves.  If you develop varicose veins, wear support hose. Elevate your feet for 15 minutes, 3-4 times a day. Limit salt in your diet.  Avoid heavy lifting, wear low heel shoes, and practice good posture.  Rest with your legs elevated if you have leg cramps or low back pain.  Visit your dentist if you have not gone yet during your pregnancy. Use a soft toothbrush to brush your teeth and be gentle when you floss.  A sexual relationship may be continued unless your health care provider directs you otherwise.  Continue to go to all your prenatal visits as directed by your health care provider. SEEK MEDICAL CARE IF:   You have dizziness.  You have mild pelvic cramps, pelvic pressure, or nagging pain in the abdominal area.  You have persistent nausea, vomiting, or diarrhea.  You have a bad smelling vaginal discharge.  You have pain with urination. SEEK IMMEDIATE MEDICAL CARE IF:   You have a fever.  You are leaking fluid from your vagina.  You have spotting or bleeding from your vagina.  You have severe abdominal cramping or pain.  You have rapid weight gain or loss.  You have shortness of breath with chest pain.  You notice sudden or extreme swelling of your face, hands, ankles, feet, or legs.  You have not felt your baby move in over an hour.  You have severe headaches that do not go away with  medicine.  You have vision changes. Document Released: 11/27/2001 Document Revised: 12/08/2013 Document Reviewed: 02/03/2013 ExitCare Patient Information 2015 [employer]. This information is not intended to replace advice given to you by your health care provider. Make sure you discuss any questions you have with your health care provider.  

## 2015-02-08 NOTE — Progress Notes (Signed)
Doing well except has some N/V. Not daily. Not eating much. Concerned about weight loss. Reviewed diet needs and encouraged to increase calories and add snacks. Has appt in [facility] today..Marland Kitchen

## 2015-03-08 ENCOUNTER — Encounter: Payer: Self-pay | Admitting: Physician Assistant

## 2015-03-08 ENCOUNTER — Ambulatory Visit (INDEPENDENT_AMBULATORY_CARE_PROVIDER_SITE_OTHER): Payer: Medicaid Other | Admitting: Physician Assistant

## 2015-03-08 VITALS — BP 92/57 | HR 87 | Wt 111.5 lb

## 2015-03-08 DIAGNOSIS — L659 Nonscarring hair loss, unspecified: Secondary | ICD-10-CM

## 2015-03-08 DIAGNOSIS — Z3492 Encounter for supervision of normal pregnancy, unspecified, second trimester: Secondary | ICD-10-CM

## 2015-03-08 LAB — POCT URINALYSIS DIP (DEVICE)
Bilirubin Urine: NEGATIVE
GLUCOSE, UA: NEGATIVE mg/dL
KETONES UR: NEGATIVE mg/dL
Nitrite: NEGATIVE
PH: 7.5 (ref 5.0–8.0)
Protein, ur: NEGATIVE mg/dL
Specific Gravity, Urine: 1.02 (ref 1.005–1.030)
Urobilinogen, UA: 0.2 mg/dL (ref 0.0–1.0)

## 2015-03-08 LAB — TSH: TSH: 3.614 u[IU]/mL (ref 0.350–4.500)

## 2015-03-08 LAB — T3, FREE: T3, Free: 2.6 pg/mL (ref 2.3–4.2)

## 2015-03-08 LAB — T4: T4 TOTAL: 10.5 ug/dL (ref 4.5–12.0)

## 2015-03-08 NOTE — Progress Notes (Signed)
16 weeks. Denies vag bleeding, LOF, dysuria.  Thinks she may feel some fetal movements.  Has one spot on top of head that is becoming bald.  Irregularly round 1.5cm diameter.  Noted 1 month ago.   OTC med sheet given. Will test Thyroid.   RTC 4 weeks ROB

## 2015-03-08 NOTE — Patient Instructions (Signed)
Second Trimester of Pregnancy The second trimester is from week 13 through week 28, months 4 through 6. The second trimester is often a time when you feel your best. Your body has also adjusted to being pregnant, and you begin to feel better physically. Usually, morning sickness has lessened or quit completely, you may have more energy, and you may have an increase in appetite. The second trimester is also a time when the fetus is growing rapidly. At the end of the sixth month, the fetus is about 9 inches long and weighs about 1 pounds. You will likely begin to feel the baby move (quickening) between 18 and 20 weeks of the pregnancy. BODY CHANGES Your body goes through many changes during pregnancy. The changes vary from woman to woman.   Your weight will continue to increase. You will notice your lower abdomen bulging out.  You may begin to get stretch marks on your hips, abdomen, and breasts.  You may develop headaches that can be relieved by medicines approved by your health care provider.  You may urinate more often because the fetus is pressing on your bladder.  You may develop or continue to have heartburn as a result of your pregnancy.  You may develop constipation because certain hormones are causing the muscles that push waste through your intestines to slow down.  You may develop hemorrhoids or swollen, bulging veins (varicose veins).  You may have back pain because of the weight gain and pregnancy hormones relaxing your joints between the bones in your pelvis and as a result of a shift in weight and the muscles that support your balance.  Your breasts will continue to grow and be tender.  Your gums may bleed and may be sensitive to brushing and flossing.  Dark spots or blotches (chloasma, mask of pregnancy) may develop on your face. This will likely fade after the baby is born.  A dark line from your belly button to the pubic area (linea nigra) may appear. This will likely fade  after the baby is born.  You may have changes in your hair. These can include thickening of your hair, rapid growth, and changes in texture. Some women also have hair loss during or after pregnancy, or hair that feels dry or thin. Your hair will most likely return to normal after your baby is born. WHAT TO EXPECT AT YOUR PRENATAL VISITS During a routine prenatal visit:  You will be weighed to make sure you and the fetus are growing normally.  Your blood pressure will be taken.  Your abdomen will be measured to track your baby's growth.  The fetal heartbeat will be listened to.  Any test results from the previous visit will be discussed. Your health care provider may ask you:  How you are feeling.  If you are feeling the baby move.  If you have had any abnormal symptoms, such as leaking fluid, bleeding, severe headaches, or abdominal cramping.  If you have any questions. Other tests that may be performed during your second trimester include:  Blood tests that check for:  Low iron levels (anemia).  Gestational diabetes (between 24 and 28 weeks).  Rh antibodies.  Urine tests to check for infections, diabetes, or protein in the urine.  An ultrasound to confirm the proper growth and development of the baby.  An amniocentesis to check for possible genetic problems.  Fetal screens for spina bifida and Down syndrome. HOME CARE INSTRUCTIONS   Avoid all smoking, herbs, alcohol, and unprescribed   drugs. These chemicals affect the formation and growth of the baby.  Follow your health care provider's instructions regarding medicine use. There are medicines that are either safe or unsafe to take during pregnancy.  Exercise only as directed by your health care provider. Experiencing uterine cramps is a good sign to stop exercising.  Continue to eat regular, healthy meals.  Wear a good support bra for breast tenderness.  Do not use hot tubs, steam rooms, or saunas.  Wear your  seat belt at all times when driving.  Avoid raw meat, uncooked cheese, cat litter boxes, and soil used by cats. These carry germs that can cause birth defects in the baby.  Take your prenatal vitamins.  Try taking a stool softener (if your health care provider approves) if you develop constipation. Eat more high-fiber foods, such as fresh vegetables or fruit and whole grains. Drink plenty of fluids to keep your urine clear or pale yellow.  Take warm sitz baths to soothe any pain or discomfort caused by hemorrhoids. Use hemorrhoid cream if your health care provider approves.  If you develop varicose veins, wear support hose. Elevate your feet for 15 minutes, 3-4 times a day. Limit salt in your diet.  Avoid heavy lifting, wear low heel shoes, and practice good posture.  Rest with your legs elevated if you have leg cramps or low back pain.  Visit your dentist if you have not gone yet during your pregnancy. Use a soft toothbrush to brush your teeth and be gentle when you floss.  A sexual relationship may be continued unless your health care provider directs you otherwise.  Continue to go to all your prenatal visits as directed by your health care provider. SEEK MEDICAL CARE IF:   You have dizziness.  You have mild pelvic cramps, pelvic pressure, or nagging pain in the abdominal area.  You have persistent nausea, vomiting, or diarrhea.  You have a bad smelling vaginal discharge.  You have pain with urination. SEEK IMMEDIATE MEDICAL CARE IF:   You have a fever.  You are leaking fluid from your vagina.  You have spotting or bleeding from your vagina.  You have severe abdominal cramping or pain.  You have rapid weight gain or loss.  You have shortness of breath with chest pain.  You notice sudden or extreme swelling of your face, hands, ankles, feet, or legs.  You have not felt your baby move in over an hour.  You have severe headaches that do not go away with  medicine.  You have vision changes. Document Released: 11/27/2001 Document Revised: 12/08/2013 Document Reviewed: 02/03/2013 ExitCare Patient Information 2015 [employer]. This information is not intended to replace advice given to you by your health care provider. Make sure you discuss any questions you have with your health care provider.  

## 2015-03-25 ENCOUNTER — Other Ambulatory Visit: Payer: Self-pay | Admitting: Physician Assistant

## 2015-03-25 ENCOUNTER — Ambulatory Visit (HOSPITAL_COMMUNITY)
Admission: RE | Admit: 2015-03-25 | Discharge: 2015-03-25 | Disposition: A | Discharge status: Home / Self Care | Payer: Medicaid Other | Source: Ambulatory Visit | Attending: Physician Assistant | Admitting: Physician Assistant

## 2015-03-25 ENCOUNTER — Ambulatory Visit (HOSPITAL_COMMUNITY): Payer: Medicaid Other

## 2015-03-25 DIAGNOSIS — Z3492 Encounter for supervision of normal pregnancy, unspecified, second trimester: Secondary | ICD-10-CM

## 2015-03-25 DIAGNOSIS — Z3A19 19 weeks gestation of pregnancy: Secondary | ICD-10-CM | POA: Insufficient documentation

## 2015-03-25 DIAGNOSIS — Z3689 Encounter for other specified antenatal screening: Secondary | ICD-10-CM | POA: Insufficient documentation

## 2015-04-08 ENCOUNTER — Ambulatory Visit (INDEPENDENT_AMBULATORY_CARE_PROVIDER_SITE_OTHER): Payer: Medicaid Other | Admitting: Physician Assistant

## 2015-04-08 VITALS — BP 101/60 | HR 109 | Temp 98.0°F | Wt 113.7 lb

## 2015-04-08 DIAGNOSIS — Z3483 Encounter for supervision of other normal pregnancy, third trimester: Secondary | ICD-10-CM

## 2015-04-08 LAB — POCT URINALYSIS DIP (DEVICE)
BILIRUBIN URINE: NEGATIVE
Glucose, UA: NEGATIVE mg/dL
Hgb urine dipstick: NEGATIVE
KETONES UR: NEGATIVE mg/dL
Nitrite: NEGATIVE
PH: 7.5 (ref 5.0–8.0)
Protein, ur: NEGATIVE mg/dL
Specific Gravity, Urine: 1.015 (ref 1.005–1.030)
Urobilinogen, UA: 0.2 mg/dL (ref 0.0–1.0)

## 2015-04-08 NOTE — Progress Notes (Signed)
Pt is undecided about AFP today Leukocytes trace  Pt is concerned about weight gain

## 2015-04-08 NOTE — Patient Instructions (Signed)
Second Trimester of Pregnancy The second trimester is from week 13 through week 28, months 4 through 6. The second trimester is often a time when you feel your best. Your body has also adjusted to being pregnant, and you begin to feel better physically. Usually, morning sickness has lessened or quit completely, you may have more energy, and you may have an increase in appetite. The second trimester is also a time when the fetus is growing rapidly. At the end of the sixth month, the fetus is about 9 inches long and weighs about 1 pounds. You will likely begin to feel the baby move (quickening) between 18 and 20 weeks of the pregnancy. BODY CHANGES Your body goes through many changes during pregnancy. The changes vary from woman to woman.   Your weight will continue to increase. You will notice your lower abdomen bulging out.  You may begin to get stretch marks on your hips, abdomen, and breasts.  You may develop headaches that can be relieved by medicines approved by your health care provider.  You may urinate more often because the fetus is pressing on your bladder.  You may develop or continue to have heartburn as a result of your pregnancy.  You may develop constipation because certain hormones are causing the muscles that push waste through your intestines to slow down.  You may develop hemorrhoids or swollen, bulging veins (varicose veins).  You may have back pain because of the weight gain and pregnancy hormones relaxing your joints between the bones in your pelvis and as a result of a shift in weight and the muscles that support your balance.  Your breasts will continue to grow and be tender.  Your gums may bleed and may be sensitive to brushing and flossing.  Dark spots or blotches (chloasma, mask of pregnancy) may develop on your face. This will likely fade after the baby is born.  A dark line from your belly button to the pubic area (linea nigra) may appear. This will likely fade  after the baby is born.  You may have changes in your hair. These can include thickening of your hair, rapid growth, and changes in texture. Some women also have hair loss during or after pregnancy, or hair that feels dry or thin. Your hair will most likely return to normal after your baby is born. WHAT TO EXPECT AT YOUR PRENATAL VISITS During a routine prenatal visit:  You will be weighed to make sure you and the fetus are growing normally.  Your blood pressure will be taken.  Your abdomen will be measured to track your baby's growth.  The fetal heartbeat will be listened to.  Any test results from the previous visit will be discussed. Your health care provider may ask you:  How you are feeling.  If you are feeling the baby move.  If you have had any abnormal symptoms, such as leaking fluid, bleeding, severe headaches, or abdominal cramping.  If you have any questions. Other tests that may be performed during your second trimester include:  Blood tests that check for:  Low iron levels (anemia).  Gestational diabetes (between 24 and 28 weeks).  Rh antibodies.  Urine tests to check for infections, diabetes, or protein in the urine.  An ultrasound to confirm the proper growth and development of the baby.  An amniocentesis to check for possible genetic problems.  Fetal screens for spina bifida and Down syndrome. HOME CARE INSTRUCTIONS   Avoid all smoking, herbs, alcohol, and unprescribed   drugs. These chemicals affect the formation and growth of the baby.  Follow your health care provider's instructions regarding medicine use. There are medicines that are either safe or unsafe to take during pregnancy.  Exercise only as directed by your health care provider. Experiencing uterine cramps is a good sign to stop exercising.  Continue to eat regular, healthy meals.  Wear a good support bra for breast tenderness.  Do not use hot tubs, steam rooms, or saunas.  Wear your  seat belt at all times when driving.  Avoid raw meat, uncooked cheese, cat litter boxes, and soil used by cats. These carry germs that can cause birth defects in the baby.  Take your prenatal vitamins.  Try taking a stool softener (if your health care provider approves) if you develop constipation. Eat more high-fiber foods, such as fresh vegetables or fruit and whole grains. Drink plenty of fluids to keep your urine clear or pale yellow.  Take warm sitz baths to soothe any pain or discomfort caused by hemorrhoids. Use hemorrhoid cream if your health care provider approves.  If you develop varicose veins, wear support hose. Elevate your feet for 15 minutes, 3-4 times a day. Limit salt in your diet.  Avoid heavy lifting, wear low heel shoes, and practice good posture.  Rest with your legs elevated if you have leg cramps or low back pain.  Visit your dentist if you have not gone yet during your pregnancy. Use a soft toothbrush to brush your teeth and be gentle when you floss.  A sexual relationship may be continued unless your health care provider directs you otherwise.  Continue to go to all your prenatal visits as directed by your health care provider. SEEK MEDICAL CARE IF:   You have dizziness.  You have mild pelvic cramps, pelvic pressure, or nagging pain in the abdominal area.  You have persistent nausea, vomiting, or diarrhea.  You have a bad smelling vaginal discharge.  You have pain with urination. SEEK IMMEDIATE MEDICAL CARE IF:   You have a fever.  You are leaking fluid from your vagina.  You have spotting or bleeding from your vagina.  You have severe abdominal cramping or pain.  You have rapid weight gain or loss.  You have shortness of breath with chest pain.  You notice sudden or extreme swelling of your face, hands, ankles, feet, or legs.  You have not felt your baby move in over an hour.  You have severe headaches that do not go away with  medicine.  You have vision changes. Document Released: 11/27/2001 Document Revised: 12/08/2013 Document Reviewed: 02/03/2013 ExitCare Patient Information 2015 [employer]. This information is not intended to replace advice given to you by your health care provider. Make sure you discuss any questions you have with your health care provider.  

## 2015-04-08 NOTE — Progress Notes (Signed)
21 weeks, stable.  Is eating 4-5 times a day.  Was concerned because scale at home said weight gain was different.  No concerns based on our measurements.  Endorses good fetal movement.  Denies LOF, VB, dysuria.   RTC 4 weeks

## 2015-04-09 ENCOUNTER — Encounter: Payer: Self-pay | Admitting: Physician Assistant

## 2015-05-06 ENCOUNTER — Ambulatory Visit (INDEPENDENT_AMBULATORY_CARE_PROVIDER_SITE_OTHER): Payer: Medicaid Other | Admitting: Obstetrics and Gynecology

## 2015-05-06 ENCOUNTER — Encounter: Payer: Self-pay | Admitting: Obstetrics and Gynecology

## 2015-05-06 VITALS — BP 104/65 | HR 85 | Temp 97.8°F | Wt 120.0 lb

## 2015-05-06 DIAGNOSIS — Z3492 Encounter for supervision of normal pregnancy, unspecified, second trimester: Secondary | ICD-10-CM

## 2015-05-06 DIAGNOSIS — Z3491 Encounter for supervision of normal pregnancy, unspecified, first trimester: Secondary | ICD-10-CM

## 2015-05-06 DIAGNOSIS — Z3402 Encounter for supervision of normal first pregnancy, second trimester: Secondary | ICD-10-CM

## 2015-05-06 LAB — POCT URINALYSIS DIP (DEVICE)
Bilirubin Urine: NEGATIVE
Glucose, UA: NEGATIVE mg/dL
HGB URINE DIPSTICK: NEGATIVE
KETONES UR: NEGATIVE mg/dL
Leukocytes, UA: NEGATIVE
Nitrite: NEGATIVE
PH: 7 (ref 5.0–8.0)
PROTEIN: NEGATIVE mg/dL
SPECIFIC GRAVITY, URINE: 1.01 (ref 1.005–1.030)
UROBILINOGEN UA: 0.2 mg/dL (ref 0.0–1.0)

## 2015-05-06 NOTE — Patient Instructions (Signed)
Second Trimester of Pregnancy The second trimester is from week 13 through week 28, months 4 through 6. The second trimester is often a time when you feel your best. Your body has also adjusted to being pregnant, and you begin to feel better physically. Usually, morning sickness has lessened or quit completely, you may have more energy, and you may have an increase in appetite. The second trimester is also a time when the fetus is growing rapidly. At the end of the sixth month, the fetus is about 9 inches long and weighs about 1 pounds. You will likely begin to feel the baby move (quickening) between 18 and 20 weeks of the pregnancy. BODY CHANGES Your body goes through many changes during pregnancy. The changes vary from woman to woman.   Your weight will continue to increase. You will notice your lower abdomen bulging out.  You may begin to get stretch marks on your hips, abdomen, and breasts.  You may develop headaches that can be relieved by medicines approved by your health care provider.  You may urinate more often because the fetus is pressing on your bladder.  You may develop or continue to have heartburn as a result of your pregnancy.  You may develop constipation because certain hormones are causing the muscles that push waste through your intestines to slow down.  You may develop hemorrhoids or swollen, bulging veins (varicose veins).  You may have back pain because of the weight gain and pregnancy hormones relaxing your joints between the bones in your pelvis and as a result of a shift in weight and the muscles that support your balance.  Your breasts will continue to grow and be tender.  Your gums may bleed and may be sensitive to brushing and flossing.  Dark spots or blotches (chloasma, mask of pregnancy) may develop on your face. This will likely fade after the baby is born.  A dark line from your belly button to the pubic area (linea nigra) may appear. This will likely fade  after the baby is born.  You may have changes in your hair. These can include thickening of your hair, rapid growth, and changes in texture. Some women also have hair loss during or after pregnancy, or hair that feels dry or thin. Your hair will most likely return to normal after your baby is born. WHAT TO EXPECT AT YOUR PRENATAL VISITS During a routine prenatal visit:  You will be weighed to make sure you and the fetus are growing normally.  Your blood pressure will be taken.  Your abdomen will be measured to track your baby's growth.  The fetal heartbeat will be listened to.  Any test results from the previous visit will be discussed. Your health care provider may ask you:  How you are feeling.  If you are feeling the baby move.  If you have had any abnormal symptoms, such as leaking fluid, bleeding, severe headaches, or abdominal cramping.  If you have any questions. Other tests that may be performed during your second trimester include:  Blood tests that check for:  Low iron levels (anemia).  Gestational diabetes (between 24 and 28 weeks).  Rh antibodies.  Urine tests to check for infections, diabetes, or protein in the urine.  An ultrasound to confirm the proper growth and development of the baby.  An amniocentesis to check for possible genetic problems.  Fetal screens for spina bifida and Down syndrome. HOME CARE INSTRUCTIONS   Avoid all smoking, herbs, alcohol, and unprescribed   drugs. These chemicals affect the formation and growth of the baby.  Follow your health care provider's instructions regarding medicine use. There are medicines that are either safe or unsafe to take during pregnancy.  Exercise only as directed by your health care provider. Experiencing uterine cramps is a good sign to stop exercising.  Continue to eat regular, healthy meals.  Wear a good support bra for breast tenderness.  Do not use hot tubs, steam rooms, or saunas.  Wear your  seat belt at all times when driving.  Avoid raw meat, uncooked cheese, cat litter boxes, and soil used by cats. These carry germs that can cause birth defects in the baby.  Take your prenatal vitamins.  Try taking a stool softener (if your health care provider approves) if you develop constipation. Eat more high-fiber foods, such as fresh vegetables or fruit and whole grains. Drink plenty of fluids to keep your urine clear or pale yellow.  Take warm sitz baths to soothe any pain or discomfort caused by hemorrhoids. Use hemorrhoid cream if your health care provider approves.  If you develop varicose veins, wear support hose. Elevate your feet for 15 minutes, 3-4 times a day. Limit salt in your diet.  Avoid heavy lifting, wear low heel shoes, and practice good posture.  Rest with your legs elevated if you have leg cramps or low back pain.  Visit your dentist if you have not gone yet during your pregnancy. Use a soft toothbrush to brush your teeth and be gentle when you floss.  A sexual relationship may be continued unless your health care provider directs you otherwise.  Continue to go to all your prenatal visits as directed by your health care provider. SEEK MEDICAL CARE IF:   You have dizziness.  You have mild pelvic cramps, pelvic pressure, or nagging pain in the abdominal area.  You have persistent nausea, vomiting, or diarrhea.  You have a bad smelling vaginal discharge.  You have pain with urination. SEEK IMMEDIATE MEDICAL CARE IF:   You have a fever.  You are leaking fluid from your vagina.  You have spotting or bleeding from your vagina.  You have severe abdominal cramping or pain.  You have rapid weight gain or loss.  You have shortness of breath with chest pain.  You notice sudden or extreme swelling of your face, hands, ankles, feet, or legs.  You have not felt your baby move in over an hour.  You have severe headaches that do not go away with  medicine.  You have vision changes. Document Released: 11/27/2001 Document Revised: 12/08/2013 Document Reviewed: 02/03/2013 ExitCare Patient Information 2015 [employer]. This information is not intended to replace advice given to you by your health care provider. Make sure you discuss any questions you have with your health care provider.  

## 2015-05-06 NOTE — Progress Notes (Signed)
MSAFP sent.

## 2015-05-11 ENCOUNTER — Encounter: Payer: Self-pay | Admitting: General Practice

## 2015-05-11 LAB — ALPHA FETOPROTEIN, MATERNAL: AFP: 138.3 ng/mL

## 2015-05-11 NOTE — Progress Notes (Signed)
[facility] called questioning patient's gestational age and confirming last ultrasound with gestational age for AFP sent on 5/20. According to last ultrasound patient is past time for AFP to be ran, order and sample will be cancelled.

## 2015-06-03 ENCOUNTER — Ambulatory Visit (INDEPENDENT_AMBULATORY_CARE_PROVIDER_SITE_OTHER): Payer: Medicaid Other | Admitting: Family Medicine

## 2015-06-03 VITALS — BP 97/62 | HR 92 | Temp 98.0°F | Wt 124.5 lb

## 2015-06-03 DIAGNOSIS — Z3402 Encounter for supervision of normal first pregnancy, second trimester: Secondary | ICD-10-CM

## 2015-06-03 DIAGNOSIS — Z23 Encounter for immunization: Secondary | ICD-10-CM | POA: Diagnosis not present

## 2015-06-03 LAB — CBC
HEMATOCRIT: 32.6 % — AB (ref 36.0–46.0)
Hemoglobin: 10.8 g/dL — ABNORMAL LOW (ref 12.0–15.0)
MCH: 30.1 pg (ref 26.0–34.0)
MCHC: 33.1 g/dL (ref 30.0–36.0)
MCV: 90.8 fL (ref 78.0–100.0)
MPV: 12.2 fL (ref 8.6–12.4)
Platelets: 204 10*3/uL (ref 150–400)
RBC: 3.59 MIL/uL — AB (ref 3.87–5.11)
RDW: 13.3 % (ref 11.5–15.5)
WBC: 13.4 10*3/uL — AB (ref 4.0–10.5)

## 2015-06-03 LAB — POCT URINALYSIS DIP (DEVICE)
Bilirubin Urine: NEGATIVE
Glucose, UA: NEGATIVE mg/dL
Ketones, ur: NEGATIVE mg/dL
NITRITE: NEGATIVE
Protein, ur: NEGATIVE mg/dL
Specific Gravity, Urine: 1.015 (ref 1.005–1.030)
Urobilinogen, UA: 0.2 mg/dL (ref 0.0–1.0)
pH: 7 (ref 5.0–8.0)

## 2015-06-03 LAB — RPR

## 2015-06-03 LAB — GLUCOSE TOLERANCE, 1 HOUR (50G) W/O FASTING: GLUCOSE 1 HOUR GTT: 114 mg/dL (ref 70–140)

## 2015-06-03 MED ORDER — TETANUS-DIPHTH-ACELL PERTUSSIS 5-2.5-18.5 LF-MCG/0.5 IM SUSP
0.5000 mL | Freq: Once | INTRAMUSCULAR | Status: AC
  Administered 2015-06-03: 0.5 mL via INTRAMUSCULAR

## 2015-06-03 MED ORDER — PRENATAL PLUS 27-1 MG PO TABS: 1.0000 | ORAL_TABLET | Freq: Every day | ORAL | Status: DC

## 2015-06-03 MED ORDER — PRENATAL PLUS 27-1 MG PO TABS: 1.0000 | ORAL_TABLET | Freq: Every day | ORAL | Status: AC

## 2015-06-03 NOTE — Progress Notes (Signed)
Patient is 24 y.o. G2P0010 [redacted]w[redacted]d.  +FM, denies LOF, VB, contractions, vaginal discharge.  Overall feeling well. - fetal kick counts reviewed - labs reviewed, MSAFP cancelled previously 2/2 drawn too late

## 2015-06-03 NOTE — Progress Notes (Signed)
Reviewed tip of week with patient  

## 2015-06-04 LAB — HIV ANTIBODY (ROUTINE TESTING W REFLEX): HIV 1&2 Ab, 4th Generation: NONREACTIVE

## 2015-06-29 ENCOUNTER — Ambulatory Visit (INDEPENDENT_AMBULATORY_CARE_PROVIDER_SITE_OTHER): Payer: Medicaid Other | Admitting: Advanced Practice Midwife

## 2015-06-29 VITALS — BP 108/63 | HR 92 | Temp 97.8°F | Wt 131.2 lb

## 2015-06-29 DIAGNOSIS — Z3403 Encounter for supervision of normal first pregnancy, third trimester: Secondary | ICD-10-CM

## 2015-06-29 LAB — POCT URINALYSIS DIP (DEVICE)
Bilirubin Urine: NEGATIVE
Glucose, UA: NEGATIVE mg/dL
Hgb urine dipstick: NEGATIVE
Ketones, ur: NEGATIVE mg/dL
Leukocytes, UA: NEGATIVE
NITRITE: NEGATIVE
Protein, ur: NEGATIVE mg/dL
SPECIFIC GRAVITY, URINE: 1.015 (ref 1.005–1.030)
Urobilinogen, UA: 0.2 mg/dL (ref 0.0–1.0)
pH: 7 (ref 5.0–8.0)

## 2015-06-29 NOTE — Patient Instructions (Addendum)
Ensure (nutrition supplement drink)  Braxton Hicks Contractions Contractions of the uterus can occur throughout pregnancy. Contractions are not always a sign that you are in labor.  WHAT ARE BRAXTON HICKS CONTRACTIONS?  Contractions that occur before labor are called Braxton Hicks contractions, or false labor. Toward the end of pregnancy (32-34 weeks), these contractions can develop more often and may become more forceful. This is not true labor because these contractions do not result in opening (dilatation) and thinning of the cervix. They are sometimes difficult to tell apart from true labor because these contractions can be forceful and people have different pain tolerances. You should not feel embarrassed if you go to the hospital with false labor. Sometimes, the only way to tell if you are in true labor is for your health care provider to look for changes in the cervix. If there are no prenatal problems or other health problems associated with the pregnancy, it is completely safe to be sent home with false labor and await the onset of true labor. HOW CAN YOU TELL THE DIFFERENCE BETWEEN TRUE AND FALSE LABOR? False Labor  The contractions of false labor are usually shorter and not as hard as those of true labor.   The contractions are usually irregular.   The contractions are often felt in the front of the lower abdomen and in the groin.   The contractions may go away when you walk around or change positions while lying down.   The contractions get weaker and are shorter lasting as time goes on.   The contractions do not usually become progressively stronger, regular, and closer together as with true labor.  True Labor  Contractions in true labor last 30-70 seconds, become very regular, usually become more intense, and increase in frequency.   The contractions do not go away with walking.   The discomfort is usually felt in the top of the uterus and spreads to the lower abdomen  and low back.   True labor can be determined by your health care provider with an exam. This will show that the cervix is dilating and getting thinner.  WHAT TO REMEMBER  Keep up with your usual exercises and follow other instructions given by your health care provider.   Take medicines as directed by your health care provider.   Keep your regular prenatal appointments.   Eat and drink lightly if you think you are going into labor.   If Braxton Hicks contractions are making you uncomfortable:   Change your position from lying down or resting to walking, or from walking to resting.   Sit and rest in a tub of warm water.   Drink 2-3 glasses of water. Dehydration may cause these contractions.   Do slow and deep breathing several times an hour.  WHEN SHOULD I SEEK IMMEDIATE MEDICAL CARE? Seek immediate medical care if:  Your contractions become stronger, more regular, and closer together.   You have fluid leaking or gushing from your vagina.   You have a fever.   You pass blood-tinged mucus.   You have vaginal bleeding.   You have continuous abdominal pain.   You have low back pain that you never had before.   You feel your baby's head pushing down and causing pelvic pressure.   Your baby is not moving as much as it used to.  Document Released: 12/03/2005 Document Revised: 12/08/2013 Document Reviewed: 09/14/2013 [facility] York[facility]Care Patient Information 2015 [place], [place]. This information is not intended to replace advice given to  you by your health care provider. Make sure you discuss any questions you have with your health care provider.

## 2015-06-29 NOTE — Progress Notes (Signed)
Reviewed weight gain, 28 week labs.

## 2015-06-29 NOTE — Progress Notes (Signed)
Reviewed tip of week with patient  

## 2015-07-13 ENCOUNTER — Ambulatory Visit (INDEPENDENT_AMBULATORY_CARE_PROVIDER_SITE_OTHER): Payer: Medicaid Other | Admitting: Family

## 2015-07-13 VITALS — BP 108/68 | HR 91 | Temp 98.2°F | Wt 135.9 lb

## 2015-07-13 DIAGNOSIS — Z3403 Encounter for supervision of normal first pregnancy, third trimester: Secondary | ICD-10-CM

## 2015-07-13 LAB — POCT URINALYSIS DIP (DEVICE)
Bilirubin Urine: NEGATIVE
Glucose, UA: NEGATIVE mg/dL
HGB URINE DIPSTICK: NEGATIVE
Ketones, ur: NEGATIVE mg/dL
Leukocytes, UA: NEGATIVE
Nitrite: NEGATIVE
PH: 7 (ref 5.0–8.0)
PROTEIN: NEGATIVE mg/dL
SPECIFIC GRAVITY, URINE: 1.02 (ref 1.005–1.030)
UROBILINOGEN UA: 0.2 mg/dL (ref 0.0–1.0)

## 2015-07-13 NOTE — Progress Notes (Signed)
Breastfeeding tip of the week reviewed. 

## 2015-07-13 NOTE — Progress Notes (Signed)
Subjective:  Wendy Ball is a 24 y.o. G2P0010 at [redacted]w[redacted]d being seen today for ongoing prenatal care.  Patient reports occasional contractions.  Contractions: Irritability.  Vag. Bleeding: None. Movement: Present. Denies leaking of fluid.   The following portions of the patient's history were reviewed and updated as appropriate: allergies, current medications, past family history, past medical history, past social history, past surgical history and problem list.   Objective:   Filed Vitals:   07/13/15 1114  BP: 108/68  Pulse: 91  Temp: 98.2 F (36.8 C)  Weight: 135 lb 14.4 oz (61.644 kg)    Fetal Status: Fetal Heart Rate (bpm): 150 Fundal Height: 36 cm Movement: Present     General:  Alert, oriented and cooperative. Patient is in no acute distress.  Skin: Skin is warm and dry. No rash noted.   Cardiovascular: Normal heart rate noted  Respiratory: Normal respiratory effort, no problems with respiration noted  Abdomen: Soft, gravid, appropriate for gestational age. Pain/Pressure: Present     Vaginal: Vag. Bleeding: None.       Cervix: Exam revealed      Cervix closed/thick  Extremities: Normal range of motion.  Edema: Trace  Mental Status: Normal mood and affect. Normal behavior. Normal judgment and thought content.   Urinalysis: Urine Protein: Negative Urine Glucose: Negative  Assessment and Plan:  Pregnancy: G2P0010 at [redacted]w[redacted]d  1. Encounter for supervision of normal first pregnancy in third trimester  Preterm labor symptoms and general obstetric precautions including but not limited to vaginal bleeding, contractions, leaking of fluid and fetal movement were reviewed in detail with the patient. Please refer to After Visit Summary for other counseling recommendations.  Return in about 1 week (around 07/20/2015).   Eino Farber Kennith Gain, CNM

## 2015-07-18 ENCOUNTER — Encounter (HOSPITAL_COMMUNITY): Payer: Self-pay | Admitting: *Deleted

## 2015-07-18 ENCOUNTER — Inpatient Hospital Stay (HOSPITAL_COMMUNITY)
Admission: AD | Admit: 2015-07-18 | Discharge: 2015-07-18 | Disposition: A | Discharge status: Home / Self Care | Payer: Medicaid Other | Source: Ambulatory Visit | Attending: Family Medicine | Admitting: Family Medicine

## 2015-07-18 DIAGNOSIS — Z3A36 36 weeks gestation of pregnancy: Secondary | ICD-10-CM | POA: Insufficient documentation

## 2015-07-18 DIAGNOSIS — R109 Unspecified abdominal pain: Secondary | ICD-10-CM | POA: Diagnosis present

## 2015-07-18 DIAGNOSIS — O4703 False labor before 37 completed weeks of gestation, third trimester: Secondary | ICD-10-CM | POA: Diagnosis not present

## 2015-07-18 LAB — URINALYSIS, ROUTINE W REFLEX MICROSCOPIC
BILIRUBIN URINE: NEGATIVE
GLUCOSE, UA: NEGATIVE mg/dL
Hgb urine dipstick: NEGATIVE
Ketones, ur: NEGATIVE mg/dL
Leukocytes, UA: NEGATIVE
Nitrite: NEGATIVE
PH: 5.5 (ref 5.0–8.0)
PROTEIN: NEGATIVE mg/dL
SPECIFIC GRAVITY, URINE: 1.02 (ref 1.005–1.030)
UROBILINOGEN UA: 0.2 mg/dL (ref 0.0–1.0)

## 2015-07-18 MED ORDER — PROMETHAZINE HCL 25 MG/ML IJ SOLN
12.5000 mg | Freq: Once | INTRAMUSCULAR | Status: AC
  Administered 2015-07-18: 12.5 mg via INTRAMUSCULAR
  Filled 2015-07-18: qty 1

## 2015-07-18 MED ORDER — NALBUPHINE HCL 10 MG/ML IJ SOLN
10.0000 mg | Freq: Once | INTRAMUSCULAR | Status: AC
Start: 2015-07-18 — End: 2015-07-18
  Administered 2015-07-18: 10 mg via INTRAMUSCULAR
  Filled 2015-07-18: qty 1

## 2015-07-18 NOTE — Discharge Instructions (Signed)
Braxton Hicks Contractions °Contractions of the uterus can occur throughout pregnancy. Contractions are not always a sign that you are in labor.  °WHAT ARE BRAXTON HICKS CONTRACTIONS?  °Contractions that occur before labor are called Braxton Hicks contractions, or false labor. Toward the end of pregnancy (32-34 weeks), these contractions can develop more often and may become more forceful. This is not true labor because these contractions do not result in opening (dilatation) and thinning of the cervix. They are sometimes difficult to tell apart from true labor because these contractions can be forceful and people have different pain tolerances. You should not feel embarrassed if you go to the hospital with false labor. Sometimes, the only way to tell if you are in true labor is for your health care provider to look for changes in the cervix. °If there are no prenatal problems or other health problems associated with the pregnancy, it is completely safe to be sent home with false labor and await the onset of true labor. °HOW CAN YOU TELL THE DIFFERENCE BETWEEN TRUE AND FALSE LABOR? °False Labor °· The contractions of false labor are usually shorter and not as hard as those of true labor.   °· The contractions are usually irregular.   °· The contractions are often felt in the front of the lower abdomen and in the groin.   °· The contractions may go away when you walk around or change positions while lying down.   °· The contractions get weaker and are shorter lasting as time goes on.   °· The contractions do not usually become progressively stronger, regular, and closer together as with true labor.   °True Labor °· Contractions in true labor last 30-70 seconds, become very regular, usually become more intense, and increase in frequency.   °· The contractions do not go away with walking.   °· The discomfort is usually felt in the top of the uterus and spreads to the lower abdomen and low back.   °· True labor can be  determined by your health care provider with an exam. This will show that the cervix is dilating and getting thinner.   °WHAT TO REMEMBER °· Keep up with your usual exercises and follow other instructions given by your health care provider.   °· Take medicines as directed by your health care provider.   °· Keep your regular prenatal appointments.   °· Eat and drink lightly if you think you are going into labor.   °· If Braxton Hicks contractions are making you uncomfortable:   °¨ Change your position from lying down or resting to walking, or from walking to resting.   °¨ Sit and rest in a tub of warm water.   °¨ Drink 2-3 glasses of water. Dehydration may cause these contractions.   °¨ Do slow and deep breathing several times an hour.   °WHEN SHOULD I SEEK IMMEDIATE MEDICAL CARE? °Seek immediate medical care if: °· Your contractions become stronger, more regular, and closer together.   °· You have fluid leaking or gushing from your vagina.   °· You have a fever.   °· You pass blood-tinged mucus.   °· You have vaginal bleeding.   °· You have continuous abdominal pain.   °· You have low back pain that you never had before.   °· You feel your baby's head pushing down and causing pelvic pressure.   °· Your baby is not moving as much as it used to.   °Document Released: 12/03/2005 Document Revised: 12/08/2013 Document Reviewed: 09/14/2013 °ExitCare® Patient Information ©2015 [employer]. This information is not intended to replace advice given to you by your health care   provider. Make sure you discuss any questions you have with your health care provider. ° °

## 2015-07-18 NOTE — MAU Provider Note (Signed)
  History   [id number] in with intermittant abd pain for two days. States GFM.  CSN: [id number]  Arrival date and time: 07/18/15 1309   First Provider Initiated Contact with Patient 07/18/15 1430      Chief Complaint  Patient presents with  . Abdominal Pain   HPI  OB History    Gravida Para Term Preterm AB TAB SAB Ectopic Multiple Living        Past Medical History  Diagnosis Date  . Medical history non-contributory     Past Surgical History  Procedure Laterality Date  . No past surgeries      History reviewed. No pertinent family history.  History  Substance Use Topics  . Smoking status: Never Smoker   . Smokeless tobacco: Never Used  . Alcohol Use: No    Allergies: No Known Allergies  Prescriptions prior to admission  Medication Sig Dispense Refill Last Dose  . prenatal vitamin w/FE, FA (PRENATAL 1 + 1) 27-1 MG TABS tablet Take 1 tablet by mouth daily at 12 noon. 90 each 3 07/17/2015 at Unknown time    Review of Systems  Constitutional: Negative.   HENT: Negative.   Eyes: Negative.   Respiratory: Negative.   Cardiovascular: Negative.   Gastrointestinal: Positive for abdominal pain.  Genitourinary: Negative.   Musculoskeletal: Negative.   Skin: Negative.   Neurological: Negative.   Endo/Heme/Allergies: Negative.   Psychiatric/Behavioral: Negative.    Physical Exam   Blood pressure 105/67, pulse 88, temperature 97.7 F (36.5 C), temperature source Oral, resp. rate 18, height  (1.575 m), weight 135 lb 14 oz (61.632 kg), last menstrual period 10/23/2014.  Physical Exam  Constitutional: She appears well-developed and well-nourished.  HENT:  Head: Normocephalic.  Eyes: Pupils are equal, round, and reactive to light.  Neck: Normal range of motion.  Cardiovascular: Normal rate, regular rhythm, normal heart sounds and intact distal pulses.   Respiratory: Effort normal and breath sounds normal.  GI: Soft. Bowel sounds are normal.   Genitourinary: Vagina normal and uterus normal.  Musculoskeletal: Normal range of motion.  Neurological: She is alert. She has normal reflexes.  Skin: Skin is warm and dry.  Psychiatric: She has a normal mood and affect. Her behavior is normal. Judgment and thought content normal.    MAU Course  Procedures  MDM False labor  Assessment and Plan  FHR pattern reassurring SVE cl/th/post/high False labor, d/c home  LAWSON, MARIE DARLENE 07/18/2015, 2:34 PM

## 2015-07-18 NOTE — MAU Note (Signed)
Patient presents at [redacted] weeks gestation with c/o abdominal pain since yesterday. Fetus active. Denies bleeding or discharge.

## 2015-07-22 ENCOUNTER — Ambulatory Visit (INDEPENDENT_AMBULATORY_CARE_PROVIDER_SITE_OTHER): Payer: Medicaid Other | Admitting: Advanced Practice Midwife

## 2015-07-22 ENCOUNTER — Encounter: Payer: Self-pay | Admitting: Advanced Practice Midwife

## 2015-07-22 VITALS — BP 106/70 | HR 101 | Wt 136.5 lb

## 2015-07-22 DIAGNOSIS — Z3493 Encounter for supervision of normal pregnancy, unspecified, third trimester: Secondary | ICD-10-CM | POA: Diagnosis not present

## 2015-07-22 DIAGNOSIS — Z3403 Encounter for supervision of normal first pregnancy, third trimester: Secondary | ICD-10-CM

## 2015-07-22 LAB — OB RESULTS CONSOLE GBS: GBS: NEGATIVE

## 2015-07-22 LAB — POCT URINALYSIS DIP (DEVICE)
Bilirubin Urine: NEGATIVE
Glucose, UA: NEGATIVE mg/dL
Ketones, ur: NEGATIVE mg/dL
LEUKOCYTES UA: NEGATIVE
NITRITE: NEGATIVE
PROTEIN: NEGATIVE mg/dL
Specific Gravity, Urine: 1.015 (ref 1.005–1.030)
UROBILINOGEN UA: 0.2 mg/dL (ref 0.0–1.0)
pH: 6.5 (ref 5.0–8.0)

## 2015-07-22 LAB — OB RESULTS CONSOLE GC/CHLAMYDIA
Chlamydia: NEGATIVE
Gonorrhea: NEGATIVE

## 2015-07-22 NOTE — Patient Instructions (Signed)
Third Trimester of Pregnancy The third trimester is from week 29 through week 42, months 7 through 9. The third trimester is a time when the fetus is growing rapidly. At the end of the ninth month, the fetus is about 20 inches in length and weighs 6-10 pounds.  BODY CHANGES Your body goes through many changes during pregnancy. The changes vary from woman to woman.   Your weight will continue to increase. You can expect to gain 25-35 pounds (11-16 kg) by the end of the pregnancy.  You may begin to get stretch marks on your hips, abdomen, and breasts.  You may urinate more often because the fetus is moving lower into your pelvis and pressing on your bladder.  You may develop or continue to have heartburn as a result of your pregnancy.  You may develop constipation because certain hormones are causing the muscles that push waste through your intestines to slow down.  You may develop hemorrhoids or swollen, bulging veins (varicose veins).  You may have pelvic pain because of the weight gain and pregnancy hormones relaxing your joints between the bones in your pelvis. Backaches may result from overexertion of the muscles supporting your posture.  You may have changes in your hair. These can include thickening of your hair, rapid growth, and changes in texture. Some women also have hair loss during or after pregnancy, or hair that feels dry or thin. Your hair will most likely return to normal after your baby is born.  Your breasts will continue to grow and be tender. A yellow discharge may leak from your breasts called colostrum.  Your belly button may stick out.  You may feel short of breath because of your expanding uterus.  You may notice the fetus "dropping," or moving lower in your abdomen.  You may have a bloody mucus discharge. This usually occurs a few days to a week before labor begins.  Your cervix becomes thin and soft (effaced) near your due date. WHAT TO EXPECT AT YOUR PRENATAL  EXAMS  You will have prenatal exams every 2 weeks until week 36. Then, you will have weekly prenatal exams. During a routine prenatal visit:  You will be weighed to make sure you and the fetus are growing normally.  Your blood pressure is taken.  Your abdomen will be measured to track your baby's growth.  The fetal heartbeat will be listened to.  Any test results from the previous visit will be discussed.  You may have a cervical check near your due date to see if you have effaced. At around 36 weeks, your caregiver will check your cervix. At the same time, your caregiver will also perform a test on the secretions of the vaginal tissue. This test is to determine if a type of bacteria, Group B streptococcus, is present. Your caregiver will explain this further. Your caregiver may ask you:  What your birth plan is.  How you are feeling.  If you are feeling the baby move.  If you have had any abnormal symptoms, such as leaking fluid, bleeding, severe headaches, or abdominal cramping.  If you have any questions. Other tests or screenings that may be performed during your third trimester include:  Blood tests that check for low iron levels (anemia).  Fetal testing to check the health, activity level, and growth of the fetus. Testing is done if you have certain medical conditions or if there are problems during the pregnancy. FALSE LABOR You may feel small, irregular contractions that   eventually go away. These are called Braxton Hicks contractions, or false labor. Contractions may last for hours, days, or even weeks before true labor sets in. If contractions come at regular intervals, intensify, or become painful, it is best to be seen by your caregiver.  SIGNS OF LABOR   Menstrual-like cramps.  Contractions that are 5 minutes apart or less.  Contractions that start on the top of the uterus and spread down to the lower abdomen and back.  A sense of increased pelvic pressure or back  pain.  A watery or bloody mucus discharge that comes from the vagina. If you have any of these signs before the 37th week of pregnancy, call your caregiver right away. You need to go to the hospital to get checked immediately. HOME CARE INSTRUCTIONS   Avoid all smoking, herbs, alcohol, and unprescribed drugs. These chemicals affect the formation and growth of the baby.  Follow your caregiver's instructions regarding medicine use. There are medicines that are either safe or unsafe to take during pregnancy.  Exercise only as directed by your caregiver. Experiencing uterine cramps is a good sign to stop exercising.  Continue to eat regular, healthy meals.  Wear a good support bra for breast tenderness.  Do not use hot tubs, steam rooms, or saunas.  Wear your seat belt at all times when driving.  Avoid raw meat, uncooked cheese, cat litter boxes, and soil used by cats. These carry germs that can cause birth defects in the baby.  Take your prenatal vitamins.  Try taking a stool softener (if your caregiver approves) if you develop constipation. Eat more high-fiber foods, such as fresh vegetables or fruit and whole grains. Drink plenty of fluids to keep your urine clear or pale yellow.  Take warm sitz baths to soothe any pain or discomfort caused by hemorrhoids. Use hemorrhoid cream if your caregiver approves.  If you develop varicose veins, wear support hose. Elevate your feet for 15 minutes, 3-4 times a day. Limit salt in your diet.  Avoid heavy lifting, wear low heal shoes, and practice good posture.  Rest a lot with your legs elevated if you have leg cramps or low back pain.  Visit your dentist if you have not gone during your pregnancy. Use a soft toothbrush to brush your teeth and be gentle when you floss.  A sexual relationship may be continued unless your caregiver directs you otherwise.  Do not travel far distances unless it is absolutely necessary and only with the approval  of your caregiver.  Take prenatal classes to understand, practice, and ask questions about the labor and delivery.  Make a trial run to the hospital.  Pack your hospital bag.  Prepare the baby's nursery.  Continue to go to all your prenatal visits as directed by your caregiver. SEEK MEDICAL CARE IF:  You are unsure if you are in labor or if your water has broken.  You have dizziness.  You have mild pelvic cramps, pelvic pressure, or nagging pain in your abdominal area.  You have persistent nausea, vomiting, or diarrhea.  You have a bad smelling vaginal discharge.  You have pain with urination. SEEK IMMEDIATE MEDICAL CARE IF:   You have a fever.  You are leaking fluid from your vagina.  You have spotting or bleeding from your vagina.  You have severe abdominal cramping or pain.  You have rapid weight loss or gain.  You have shortness of breath with chest pain.  You notice sudden or extreme swelling   of your face, hands, ankles, feet, or legs.  You have not felt your baby move in over an hour.  You have severe headaches that do not go away with medicine.  You have vision changes. Document Released: 11/27/2001 Document Revised: 12/08/2013 Document Reviewed: 02/03/2013 ExitCare Patient Information 2015 [employer]. This information is not intended to replace advice given to you by your health care provider. Make sure you discuss any questions you have with your health care provider.  

## 2015-07-22 NOTE — Progress Notes (Signed)
Subjective:  Wendy Ball is a 24 y.o. G2P0010 at [redacted]w[redacted]d being seen today for ongoing prenatal care.  Patient reports backache, occasional contractions and Pressure in RUQ from baby putting feet up there.    Contractions: Irregular.  Vag. Bleeding: None. Movement: Present. Denies leaking of fluid. Wants to have the baby "right now".    Was seen for labor eval earlier this week and sent home. Cervix was closed and long.   The following portions of the patient's history were reviewed and updated as appropriate: allergies, current medications, past family history, past medical history, past social history, past surgical history and problem list.   Objective:   Filed Vitals:   07/22/15 1018  BP: 106/70  Pulse: 101  Weight: 136 lb 8 oz (61.916 kg)    Fetal Status: Fetal Heart Rate (bpm): 135 Fundal Height: 36 cm Movement: Present  Presentation: Vertex  General:  Alert, oriented and cooperative. Patient is in no acute distress.  Skin: Skin is warm and dry. No rash noted.   Cardiovascular: Normal heart rate noted  Respiratory: Normal respiratory effort, no problems with respiration noted  Abdomen: Soft, gravid, appropriate for gestational age. Pain/Pressure: Present     Vaginal: Vag. Bleeding: None.       Cervix: closed/50%/-3/vertex/soft        Extremities: Normal range of motion.  Edema: Trace  Mental Status: Normal mood and affect. Normal behavior. Normal judgment and thought content.   Urinalysis: Urine Protein: Negative (Simultaneous filing. User may not have seen previous data.) Urine Glucose: Negative (Simultaneous filing. User may not have seen previous data.)  Assessment and Plan:  Pregnancy: G2P0010 at [redacted]w[redacted]d  1. Encounter for supervision of normal first pregnancy in third trimester     Late pregnancy discomforts reviewed. Reassured these are expected. Comfort measures. May use Benadryl for sleep. States they gave her one in MAU and it helped her a lot to sleep.   - GC/Chlamydia  Probe Amp    GBS collected  Term labor symptoms and general obstetric precautions including but not limited to vaginal bleeding, contractions, leaking of fluid and fetal movement were reviewed in detail with the patient. Please refer to After Visit Summary for other counseling recommendations.  Return in about 1 week (around 07/29/2015) for [facility].   Aviva Signs, CNM

## 2015-07-23 LAB — GC/CHLAMYDIA PROBE AMP
CT Probe RNA: NEGATIVE
GC PROBE AMP APTIMA: NEGATIVE

## 2015-07-24 LAB — CULTURE, BETA STREP (GROUP B ONLY)

## 2015-08-02 ENCOUNTER — Encounter (HOSPITAL_COMMUNITY): Payer: Self-pay | Admitting: *Deleted

## 2015-08-02 ENCOUNTER — Inpatient Hospital Stay (HOSPITAL_COMMUNITY): Payer: Medicaid Other

## 2015-08-02 ENCOUNTER — Inpatient Hospital Stay (HOSPITAL_COMMUNITY)
Admission: AD | Admit: 2015-08-02 | Discharge: 2015-08-02 | Disposition: A | Discharge status: Home / Self Care | Payer: Medicaid Other | Source: Ambulatory Visit | Attending: Family Medicine | Admitting: Family Medicine

## 2015-08-02 DIAGNOSIS — R1084 Generalized abdominal pain: Secondary | ICD-10-CM | POA: Diagnosis not present

## 2015-08-02 DIAGNOSIS — Z3A37 37 weeks gestation of pregnancy: Secondary | ICD-10-CM | POA: Insufficient documentation

## 2015-08-02 DIAGNOSIS — O9989 Other specified diseases and conditions complicating pregnancy, childbirth and the puerperium: Secondary | ICD-10-CM | POA: Insufficient documentation

## 2015-08-02 DIAGNOSIS — R109 Unspecified abdominal pain: Secondary | ICD-10-CM

## 2015-08-02 DIAGNOSIS — R1031 Right lower quadrant pain: Secondary | ICD-10-CM | POA: Insufficient documentation

## 2015-08-02 DIAGNOSIS — M62830 Muscle spasm of back: Secondary | ICD-10-CM | POA: Diagnosis not present

## 2015-08-02 DIAGNOSIS — O26899 Other specified pregnancy related conditions, unspecified trimester: Secondary | ICD-10-CM

## 2015-08-02 LAB — URINALYSIS, ROUTINE W REFLEX MICROSCOPIC
BILIRUBIN URINE: NEGATIVE
Glucose, UA: NEGATIVE mg/dL
Ketones, ur: NEGATIVE mg/dL
Nitrite: NEGATIVE
PH: 6.5 (ref 5.0–8.0)
Protein, ur: NEGATIVE mg/dL
Urobilinogen, UA: 0.2 mg/dL (ref 0.0–1.0)

## 2015-08-02 LAB — URINE MICROSCOPIC-ADD ON

## 2015-08-02 MED ORDER — OXYCODONE-ACETAMINOPHEN 5-325 MG PO TABS: 1.0000 | ORAL_TABLET | Freq: Four times a day (QID) | ORAL | Status: DC | PRN

## 2015-08-02 MED ORDER — OXYCODONE-ACETAMINOPHEN 5-325 MG PO TABS
1.0000 | ORAL_TABLET | Freq: Once | ORAL | Status: AC
Start: 2015-08-02 — End: 2015-08-02
  Administered 2015-08-02: 1 via ORAL
  Filled 2015-08-02: qty 1

## 2015-08-02 NOTE — Progress Notes (Signed)
Dr Natale Milch notified of pt's admission and status. Will see pt

## 2015-08-02 NOTE — Progress Notes (Signed)
Dr Natale Milch in MAU. Aware pt returned from u/s and awaiting results. OK to leave pt off EFM

## 2015-08-02 NOTE — Progress Notes (Signed)
Written and verbal d/c instructions given and understanding voiced. 

## 2015-08-02 NOTE — Progress Notes (Signed)
Pt is very uncomfortable between ctxs with pain mostly R side, RLQ, and R flank. Abd relaxes between ctxs. Baby active and does not have pain with FM

## 2015-08-02 NOTE — MAU Provider Note (Signed)
History     CSN: [id number]  Arrival date and time: 08/02/15 1422    Chief Complaint  Patient presents with  . Abdominal Pain   HPI   Patient is 24 y.o. G2P0010 [redacted]w[redacted]d here with complaints of RLQ and flank pain.  Wendy Ball reports that for the last 2-3 days, she has been experiencing sharp intermittent RLQ and R flank pain. One hour ago, the pain became constant, very severe, and spread across her entire abdomen. She has difficulty inhaling deeply due to pain. She has been unable to sleep at night due to pain. She has not tried anything to help alleviate her symptoms.  She endorses feeling hot over the past week, but says she has not measured her temperature. She denies diaphoresis or chills. She reports no surgical history or history of kidney stones. She denies dysuria.  She is also having contractions about every 5 minutes, but says she cannot feel them. Endorses +FM, no LOF or vaginal bleeding.    Past Medical History  Diagnosis Date  . Medical history non-contributory     Past Surgical History  Procedure Laterality Date  . No past surgeries      History reviewed. No pertinent family history.  Social History  Substance Use Topics  . Smoking status: Never Smoker   . Smokeless tobacco: Never Used  . Alcohol Use: No    Allergies: No Known Allergies  Prescriptions prior to admission  Medication Sig Dispense Refill Last Dose  . prenatal vitamin w/FE, FA (PRENATAL 1 + 1) 27-1 MG TABS tablet Take 1 tablet by mouth daily at 12 noon. 90 each 3 08/01/2015 at Unknown time    ROS Physical Exam   Blood pressure 100/74, pulse 97, temperature 97.6 F (36.4 C), resp. rate 20, height  (1.575 m), weight 137 lb 12.8 oz (62.506 kg), last menstrual period 10/23/2014.  Physical Exam  Constitutional: She is oriented to person, place, and time.  Appears very uncomfortable, squirming constantly in bed, grimacing intermittently   HENT:  Head: Normocephalic and atraumatic.   Cardiovascular: Normal rate and regular rhythm.   No murmur heard. Respiratory: Effort normal and breath sounds normal. No respiratory distress. She has no wheezes.  GI:  Gravid  Neurological: She is alert and oriented to person, place, and time. No cranial nerve deficit.  Psychiatric: She has a normal mood and affect. Her behavior is normal.    MAU Course  Procedures Renal US   MDM  US Renal  08/02/2015   CLINICAL DATA:  Third trimester pregnancy, abdominal pain right lower quadrant and right flank  EXAM: RENAL / URINARY TRACT ULTRASOUND COMPLETE  COMPARISON:  None.  FINDINGS: Right Kidney:  Length: 11 cm. Echogenicity within normal limits. No mass or hydronephrosis visualized.  Left Kidney:  Length: 11.7 cm. Echogenicity within normal limits. No mass or hydronephrosis visualized.  Bladder:  Not distended and therefore not evaluated  IMPRESSION: Kidneys appear normal with no evidence of hydronephrosis   Electronically Signed   By: Esperanza Heir M.D.   On: 08/02/2015 16:14  UA not suggestive of UTI   Assessment and Plan   A: Patient is 24 y.o. G2P0010 [redacted]w[redacted]d reporting RLQ and flank pain most likely secondary to musculoskeletal back spasms +/- contractions. Less likely nephrolithiasis given negative renal US. Pain improved slightly with percocet 5-325. Has prenatal appointment tomorrow - encouraged patient to request to see Dr. [name].   P:  - Heating pad, back stretches/prenatal yoga,Tylenol q6 and Percocet PRN for  pain control - Encouraged patient to return if fever develops or contractions become very severe or closer together - Handout given about abdominal pain in pregnancy  - Attend already scheduled prenatal appointment tomorrow    Wendy Ball 08/02/2015, 4:43 PM   OB fellow attestation: I have seen and examined this patient; I agree with above documentation in the resident's note.  Wendy Ball is a 24 y.o. G2P0010 reporting back and abdominal pain +FM, denies  LOF, VB, contractions, vaginal discharge.  PE: BP 105/69 mmHg  Pulse 81  Temp(Src) 98 F (36.7 C)  Resp 20  Ht 5\' 2"  (1.575 m)  Wt 137 lb 12.8 oz (62.506 kg)  BMI 25.20 kg/m2  LMP 10/23/2014 Gen: calm comfortable, NAD Resp: normal effort, no distress Abd: gravid. Soft non-tender. No CVA tenderness.  MSK: TTP along paraspinal muscles.   ROS, labs, PMH reviewed NST reactive   Plan: - fetal kick counts reinforced, labor precautions - Recommended tylenol, heat and stretching - Provided percocet #10 for severe pain - Recommended f/u with Dr. [name]  - continue routine follow up in [facility] clinic  Federico Flake, MD 4:54 PM

## 2015-08-02 NOTE — Discharge Instructions (Signed)
Take Tylenol 1 tablet every 6 hours for the next 2-3 days for back pain Take your percocet only for severe pain We recommend stretching- consider prenatal yoga Heating pad to back   Return to care for: vaginal bleeding, regular contractions every 2-3 minutes for 1 hour, decreased fetal movement, or leaking fluid.  Abdominal Pain During Pregnancy Abdominal pain is common in pregnancy. Most of the time, it does not cause harm. There are many causes of abdominal pain. Some causes are more serious than others. Some of the causes of abdominal pain in pregnancy are easily diagnosed. Occasionally, the diagnosis takes time to understand. Other times, the cause is not determined. Abdominal pain can be a sign that something is very wrong with the pregnancy, or the pain may have nothing to do with the pregnancy at all. For this reason, always tell your health care provider if you have any abdominal discomfort. HOME CARE INSTRUCTIONS  Monitor your abdominal pain for any changes. The following actions may help to alleviate any discomfort you are experiencing:  Do not have sexual intercourse or put anything in your vagina until your symptoms go away completely.  Get plenty of rest until your pain improves.  Drink clear fluids if you feel nauseous. Avoid solid food as long as you are uncomfortable or nauseous.  Only take over-the-counter or prescription medicine as directed by your health care provider.  Keep all follow-up appointments with your health care provider. SEEK IMMEDIATE MEDICAL CARE IF:  You are bleeding, leaking fluid, or passing tissue from the vagina.  You have increasing pain or cramping.  You have persistent vomiting.  You have painful or bloody urination.  You have a fever.  You notice a decrease in your baby's movements.  You have extreme weakness or feel faint.  You have shortness of breath, with or without abdominal pain.  You develop a severe headache with abdominal  pain.  You have abnormal vaginal discharge with abdominal pain.  You have persistent diarrhea.  You have abdominal pain that continues even after rest, or gets worse. MAKE SURE YOU:   Understand these instructions.  Will watch your condition.  Will get help right away if you are not doing well or get worse. Document Released: 12/03/2005 Document Revised: 09/23/2013 Document Reviewed: 07/02/2013 [facility] Patient Information 2015 [place], [place]. This information is not intended to replace advice given to you by your health care provider. Make sure you discuss any questions you have with your health care provider.

## 2015-08-02 NOTE — MAU Note (Signed)
Having cramping pain in lower stomach that suddenly started . Denies LOF or bleeding

## 2015-08-02 NOTE — Progress Notes (Signed)
Dr Lancaster in to see pt 

## 2015-08-03 ENCOUNTER — Ambulatory Visit (INDEPENDENT_AMBULATORY_CARE_PROVIDER_SITE_OTHER): Payer: Medicaid Other | Admitting: Advanced Practice Midwife

## 2015-08-03 VITALS — BP 105/70 | HR 92 | Wt 138.8 lb

## 2015-08-03 DIAGNOSIS — M62830 Muscle spasm of back: Secondary | ICD-10-CM

## 2015-08-03 DIAGNOSIS — Z3403 Encounter for supervision of normal first pregnancy, third trimester: Secondary | ICD-10-CM

## 2015-08-03 LAB — POCT URINALYSIS DIP (DEVICE)
BILIRUBIN URINE: NEGATIVE
Glucose, UA: NEGATIVE mg/dL
Ketones, ur: NEGATIVE mg/dL
Leukocytes, UA: NEGATIVE
Nitrite: NEGATIVE
Protein, ur: NEGATIVE mg/dL
Specific Gravity, Urine: 1.015 (ref 1.005–1.030)
Urobilinogen, UA: 0.2 mg/dL (ref 0.0–1.0)
pH: 6 (ref 5.0–8.0)

## 2015-08-03 MED ORDER — CYCLOBENZAPRINE HCL 10 MG PO TABS: 10.0000 mg | ORAL_TABLET | Freq: Three times a day (TID) | ORAL | Status: DC | PRN

## 2015-08-03 NOTE — Progress Notes (Signed)
Subjective:  Wendy Ball is a 24 y.o. G2P0010 at [redacted]w[redacted]d being seen today for ongoing prenatal care.  Patient reports irregular contractions, 1-2 per hour. Was seen in MAU yesterday for back pain; prescribed percocet and reports mild improvement.  Contractions: Irregular.  Vag. Bleeding: None. Movement: Present. Denies leaking of fluid.   The following portions of the patient's history were reviewed and updated as appropriate: allergies, current medications, past family history, past medical history, past social history, past surgical history and problem list.   Objective:   Filed Vitals:   08/03/15 1019  BP: 105/70  Pulse: 92  Weight: 138 lb 12.8 oz (62.959 kg)    Fetal Status: Fetal Heart Rate (bpm): 144 Fundal Height: 38 cm Movement: Present     General:  Alert, oriented and cooperative. Patient is in no acute distress.  Skin: Skin is warm and dry. No rash noted.   Cardiovascular: Normal heart rate noted  Respiratory: Normal respiratory effort, no problems with respiration noted  Abdomen: Soft, gravid, appropriate for gestational age. Pain/Pressure: Present     Pelvic: Vag. Bleeding: None     Cervical exam performed      1/50/-3-vertex  Extremities: Normal range of motion.  Edema: Trace  Mental Status: Normal mood and affect. Normal behavior. Normal judgment and thought content.    Urinalysis:     Urine glucose & urine protein negative  Assessment and Plan:  Pregnancy: G2P0010 at [redacted]w[redacted]d  1. Supervision of normal first pregnancy in third trimester -No complications at this time.  -Return in 1 week for routine follow up  2. Muscle spasm of back -Rx flexeril -Schedule appt with Dr. [name] as recommended by Dr. Alvester Morin.  -Pt provided education for back pain in pregnancy  Term labor symptoms and general obstetric precautions including but not limited to vaginal bleeding, contractions, leaking of fluid and fetal movement were reviewed in detail with the patient. Please refer to  After Visit Summary for other counseling recommendations.  Schedule for first available with Dr. [name] for adjustment and return in 1 week for routine OB visit.   Judeth Horn, NP

## 2015-08-03 NOTE — Progress Notes (Signed)
Pt seen in MAU yesterday had cervical exam and was one cm.

## 2015-08-03 NOTE — Patient Instructions (Signed)
Back Pain in Pregnancy °Back pain during pregnancy is common. It happens in about half of all pregnancies. It is important for you and your baby that you remain active during your pregnancy. If you feel that back pain is not allowing you to remain active or sleep well, it is time to see your caregiver. Back pain may be caused by several factors related to changes during your pregnancy. Fortunately, unless you had trouble with your back before your pregnancy, the pain is likely to get better after you deliver. °Low back pain usually occurs between the fifth and seventh months of pregnancy. It can, however, happen in the first couple months. Factors that increase the risk of back problems include:  °· Previous back problems. °· Injury to your back. °· Having twins or multiple births. °· A chronic cough. °· Stress. °· Job-related repetitive motions. °· Muscle or spinal disease in the back. °· Family history of back problems, ruptured (herniated) discs, or osteoporosis. °· Depression, anxiety, and panic attacks. °CAUSES  °· When you are pregnant, your body produces a hormone called relaxin. This hormone makes the ligaments connecting the low back and pubic bones more flexible. This flexibility allows the baby to be delivered more easily. When your ligaments are loose, your muscles need to work harder to support your back. Soreness in your back can come from tired muscles. Soreness can also come from back tissues that are irritated since they are receiving less support. °· As the baby grows, it puts pressure on the nerves and blood vessels in your pelvis. This can cause back pain. °· As the baby grows and gets heavier during pregnancy, the uterus pushes the stomach muscles forward and changes your center of gravity. This makes your back muscles work harder to maintain good posture. °SYMPTOMS  °Lumbar pain during pregnancy °Lumbar pain during pregnancy usually occurs at or above the waist in the center of the back. There  may be pain and numbness that radiates into your leg or foot. This is similar to low back pain experienced by non-pregnant women. It usually increases with sitting for long periods of time, standing, or repetitive lifting. Tenderness may also be present in the muscles along your upper back. °Posterior pelvic pain during pregnancy °Pain in the back of the pelvis is more common than lumbar pain in pregnancy. It is a deep pain felt in your side at the waistline, or across the tailbone (sacrum), or in both places. You may have pain on one or both sides. This pain can also go into the buttocks and backs of the upper thighs. Pubic and groin pain may also be present. The pain does not quickly resolve with rest, and morning stiffness may also be present. °Pelvic pain during pregnancy can be brought on by most activities. A high level of fitness before and during pregnancy may or may not prevent this problem. Labor pain is usually 1 to 2 minutes apart, lasts for about 1 minute, and involves a bearing down feeling or pressure in your pelvis. However, if you are at term with the pregnancy, constant low back pain can be the beginning of early labor, and you should be aware of this. °DIAGNOSIS  °X-rays of the back should not be done during the first 12 to 14 weeks of the pregnancy and only when absolutely necessary during the rest of the pregnancy. MRIs do not give off radiation and are safe during pregnancy. MRIs also should only be done when absolutely necessary. °HOME CARE INSTRUCTIONS °· Exercise   as directed by your caregiver. Exercise is the most effective way to prevent or manage back pain. If you have a back problem, it is especially important to avoid sports that require sudden body movements. Swimming and walking are great activities. °· Do not stand in one place for long periods of time. °· Do not wear high heels. °· Sit in chairs with good posture. Use a pillow on your lower back if necessary. Make sure your head  rests over your shoulders and is not hanging forward. °· Try sleeping on your side, preferably the left side, with a pillow or two between your legs. If you are sore after a night's rest, your bed may be too soft. Try placing a board between your mattress and box spring. °· Listen to your body when lifting. If you are experiencing pain, ask for help or try bending your knees more so you can use your leg muscles rather than your back muscles. Squat down when picking up something from the floor. Do not bend over. °· Eat a healthy diet. Try to gain weight within your caregiver's recommendations. °· Use heat or cold packs 3 to 4 times a day for 15 minutes to help with the pain. °· Only take over-the-counter or prescription medicines for pain, discomfort, or fever as directed by your caregiver. °Sudden (acute) back pain °· Use bed rest for only the most extreme, acute episodes of back pain. Prolonged bed rest over 48 hours will aggravate your condition. °· Ice is very effective for acute conditions. °¨ Put ice in a plastic bag. °¨ Place a towel between your skin and the bag. °¨ Leave the ice on for 10 to 20 minutes every 2 hours, or as needed. °· Using heat packs for 30 minutes prior to activities is also helpful. °Continued back pain °See your caregiver if you have continued problems. Your caregiver can help or refer you for appropriate physical therapy. With conditioning, most back problems can be avoided. Sometimes, a more serious issue may be the cause of back pain. You should be seen right away if new problems seem to be developing. Your caregiver may recommend: °· A maternity girdle. °· An elastic sling. °· A back brace. °· A massage therapist or acupuncture. °SEEK MEDICAL CARE IF:  °· You are not able to do most of your daily activities, even when taking the pain medicine you were given. °· You need a referral to a physical therapist or chiropractor. °· You want to try acupuncture. °SEEK IMMEDIATE MEDICAL CARE  IF: °· You develop numbness, tingling, weakness, or problems with the use of your arms or legs. °· You develop severe back pain that is no longer relieved with medicines. °· You have a sudden change in bowel or bladder control. °· You have increasing pain in other areas of the body. °· You develop shortness of breath, dizziness, or fainting. °· You develop nausea, vomiting, or sweating. °· You have back pain which is similar to labor pains. °· You have back pain along with your water breaking or vaginal bleeding. °· You have back pain or numbness that travels down your leg. °· Your back pain developed after you fell. °· You develop pain on one side of your back. You may have a kidney stone. °· You see blood in your urine. You may have a bladder infection or kidney stone. °· You have back pain with blisters. You may have shingles. °Back pain is fairly common during pregnancy but should not be accepted as just part of   the process. Back pain should always be treated as soon as possible. This will make your pregnancy as pleasant as possible. °Document Released: 03/13/2006 Document Revised: 02/25/2012 Document Reviewed: 04/24/2011 °Exit[employer]® Patient Information ©2015 [employer]. This information is not intended to replace advice given to you by your health care provider. Make sure you discuss any questions you have with your health care provider. °Braxton Hicks Contractions °Contractions of the uterus can occur throughout pregnancy. Contractions are not always a sign that you are in labor.  °WHAT ARE BRAXTON HICKS CONTRACTIONS?  °Contractions that occur before labor are called Braxton Hicks contractions, or false labor. Toward the end of pregnancy (32-34 weeks), these contractions can develop more often and may become more forceful. This is not true labor because these contractions do not result in opening (dilatation) and thinning of the cervix. They are sometimes difficult to tell apart from true labor because these  contractions can be forceful and people have different pain tolerances. You should not feel embarrassed if you go to the hospital with false labor. Sometimes, the only way to tell if you are in true labor is for your health care provider to look for changes in the cervix. °If there are no prenatal problems or other health problems associated with the pregnancy, it is completely safe to be sent home with false labor and await the onset of true labor. °HOW CAN YOU TELL THE DIFFERENCE BETWEEN TRUE AND FALSE LABOR? °False Labor °· The contractions of false labor are usually shorter and not as hard as those of true labor.   °· The contractions are usually irregular.   °· The contractions are often felt in the front of the lower abdomen and in the groin.   °· The contractions may go away when you walk around or change positions while lying down.   °· The contractions get weaker and are shorter lasting as time goes on.   °· The contractions do not usually become progressively stronger, regular, and closer together as with true labor.   °True Labor °· Contractions in true labor last 30-70 seconds, become very regular, usually become more intense, and increase in frequency.   °· The contractions do not go away with walking.   °· The discomfort is usually felt in the top of the uterus and spreads to the lower abdomen and low back.   °· True labor can be determined by your health care provider with an exam. This will show that the cervix is dilating and getting thinner.   °WHAT TO REMEMBER °· Keep up with your usual exercises and follow other instructions given by your health care provider.   °· Take medicines as directed by your health care provider.   °· Keep your regular prenatal appointments.   °· Eat and drink lightly if you think you are going into labor.   °· If Braxton Hicks contractions are making you uncomfortable:   °· Change your position from lying down or resting to walking, or from walking to resting.   °· Sit  and rest in a tub of warm water.   °· Drink 2-3 glasses of water. Dehydration may cause these contractions.   °· Do slow and deep breathing several times an hour.   °WHEN SHOULD I SEEK IMMEDIATE MEDICAL CARE? °Seek immediate medical care if: °· Your contractions become stronger, more regular, and closer together.   °· You have fluid leaking or gushing from your vagina.   °· You have a fever.   °· You pass blood-tinged mucus.   °· You have vaginal bleeding.   °· You have continuous abdominal pain.   °· You   have low back pain that you never had before.   °· You feel your baby's head pushing down and causing pelvic pressure.   °· Your baby is not moving as much as it used to.   °Document Released: 12/03/2005 Document Revised: 12/08/2013 Document Reviewed: 09/14/2013 °ExitCare® Patient Information ©2015 [employer]. This information is not intended to replace advice given to you by your health care provider. Make sure you discuss any questions you have with your health care provider. ° °

## 2015-08-05 ENCOUNTER — Ambulatory Visit (INDEPENDENT_AMBULATORY_CARE_PROVIDER_SITE_OTHER): Payer: Medicaid Other | Admitting: Family Medicine

## 2015-08-05 VITALS — BP 103/68 | HR 79 | Wt 138.6 lb

## 2015-08-05 DIAGNOSIS — O26893 Other specified pregnancy related conditions, third trimester: Secondary | ICD-10-CM

## 2015-08-05 DIAGNOSIS — M549 Dorsalgia, unspecified: Secondary | ICD-10-CM | POA: Diagnosis not present

## 2015-08-05 DIAGNOSIS — O99891 Other specified diseases and conditions complicating pregnancy: Secondary | ICD-10-CM

## 2015-08-05 DIAGNOSIS — O9989 Other specified diseases and conditions complicating pregnancy, childbirth and the puerperium: Principal | ICD-10-CM

## 2015-08-05 DIAGNOSIS — M9909 Segmental and somatic dysfunction of abdomen and other regions: Secondary | ICD-10-CM | POA: Diagnosis not present

## 2015-08-05 NOTE — Progress Notes (Signed)
Subjective:  Wendy Ball is a 24 y.o. G2P0010 at [redacted]w[redacted]d being seen today for back pain in pregnancy.  Patient reports right lower back pain with minimal radiation into posterior thigh.  Started several weeks ago and has worsened.  Percocet somewhat helpful, but this has made her constipation.  Contractions: Irregular.  Vag. Bleeding: None. Movement: Present. Denies leaking of fluid.   The following portions of the patient's history were reviewed and updated as appropriate: allergies, current medications, past family history, past medical history, past social history, past surgical history and problem list.   Objective:   Filed Vitals:   08/05/15 0807  BP: 103/68  Pulse: 79  Weight: 138 lb 9.6 oz (62.869 kg)    Fetal Status:     Movement: Present     General:  Alert, oriented and cooperative. Patient is in no acute distress.  Skin: Skin is warm and dry. No rash noted.   Cardiovascular: Normal heart rate noted  Respiratory: Normal respiratory effort, no problems with respiration noted  Abdomen: Soft, gravid, appropriate for gestational age. Pain/Pressure: Present     MSK: Tender to palpation in right lumbar paraspinals.  Hypertonic lumbar paraspinals  OSE  L/L sacral torsion, L5 ESRR, R ant innom.  Extremities: Normal range of motion.  Edema: Trace  Mental Status: Normal mood and affect. Normal behavior. Normal judgment and thought content.   Urinalysis:      Assessment and Plan:  Pregnancy: G2P0010 at [redacted]w[redacted]d  1. Back pain affecting pregnancy in third trimester Recommended exercises to strengthen pelvic stabilizer muscles.  If continues with percocet, recommended stool softener.    2. Somatic dysfunction OMT done with pt's permission   Levie Heritage, DO

## 2015-08-05 NOTE — Progress Notes (Signed)
Pt brought back to see Dr. [name] for back pain issues, had prenatal visit on 08/17.

## 2015-08-10 ENCOUNTER — Ambulatory Visit (INDEPENDENT_AMBULATORY_CARE_PROVIDER_SITE_OTHER): Payer: Medicaid Other | Admitting: Family

## 2015-08-10 VITALS — BP 102/68 | HR 89 | Temp 98.2°F | Wt 139.6 lb

## 2015-08-10 DIAGNOSIS — O3663X1 Maternal care for excessive fetal growth, third trimester, fetus 1: Secondary | ICD-10-CM

## 2015-08-10 LAB — POCT URINALYSIS DIP (DEVICE)
Bilirubin Urine: NEGATIVE
Glucose, UA: NEGATIVE mg/dL
KETONES UR: NEGATIVE mg/dL
Leukocytes, UA: NEGATIVE
Nitrite: NEGATIVE
PH: 7 (ref 5.0–8.0)
Protein, ur: NEGATIVE mg/dL
SPECIFIC GRAVITY, URINE: 1.015 (ref 1.005–1.030)
Urobilinogen, UA: 0.2 mg/dL (ref 0.0–1.0)

## 2015-08-10 NOTE — Progress Notes (Signed)
Breastfeeding tip of the week reviewed Urine trace hgb Declines flu today Pt has question of need for [place], weight of baby

## 2015-08-11 NOTE — Progress Notes (Signed)
Subjective:  Wendy Ball is a 24 y.o. G2P0010 at [redacted]w[redacted]d being seen today for ongoing prenatal care.  Patient reports no complaints.  Contractions: Regular.  Vag. Bleeding: None. Movement: Present. Denies leaking of fluid.   The following portions of the patient's history were reviewed and updated as appropriate: allergies, current medications, past family history, past medical history, past social history, past surgical history and problem list.   Objective:   Filed Vitals:   08/10/15 1139  BP: 102/68  Pulse: 89  Temp: 98.2 F (36.8 C)  Weight: 139 lb 9.6 oz (63.322 kg)    Fetal Status: Fetal Heart Rate (bpm): 140 Fundal Height: 41 cm Movement: Present  Presentation: Vertex  General:  Alert, oriented and cooperative. Patient is in no acute distress.  Skin: Skin is warm and dry. No rash noted.   Cardiovascular: Normal heart rate noted  Respiratory: Normal respiratory effort, no problems with respiration noted  Abdomen: Soft, gravid, appropriate for gestational age. Pain/Pressure: Absent     Pelvic: Vag. Bleeding: None     Cervical exam performed Dilation: 1 Effacement (%): 50 Station: -3  Extremities: Normal range of motion.  Edema: Trace  Mental Status: Normal mood and affect. Normal behavior. Normal judgment and thought content.   Urinalysis: Urine Protein: Negative Urine Glucose: Negative  Assessment and Plan:  Pregnancy: G2P0010 at [redacted]w[redacted]d  1. LGA (large for gestational age) fetus affecting mother, antepartum, third trimester, fetus 1  - [place] MFM OB FOLLOW UP; Future  Term labor symptoms and general obstetric precautions including but not limited to vaginal bleeding, contractions, leaking of fluid and fetal movement were reviewed in detail with the patient. Please refer to After Visit Summary for other counseling recommendations.  Return in about 1 week (around 08/17/2015) for appt and NST.   Eino Farber Kennith Gain, CNM

## 2015-08-15 ENCOUNTER — Ambulatory Visit (HOSPITAL_COMMUNITY)
Admission: RE | Admit: 2015-08-15 | Discharge: 2015-08-15 | Disposition: A | Discharge status: Home / Self Care | Payer: Medicaid Other | Source: Ambulatory Visit | Attending: Family | Admitting: Family

## 2015-08-15 DIAGNOSIS — O3660X0 Maternal care for excessive fetal growth, unspecified trimester, not applicable or unspecified: Secondary | ICD-10-CM | POA: Diagnosis not present

## 2015-08-15 DIAGNOSIS — O3663X1 Maternal care for excessive fetal growth, third trimester, fetus 1: Secondary | ICD-10-CM

## 2015-08-15 DIAGNOSIS — Z3A00 Weeks of gestation of pregnancy not specified: Secondary | ICD-10-CM | POA: Insufficient documentation

## 2015-08-16 ENCOUNTER — Encounter (HOSPITAL_COMMUNITY): Payer: Self-pay | Admitting: *Deleted

## 2015-08-16 ENCOUNTER — Inpatient Hospital Stay (HOSPITAL_COMMUNITY)
Admission: AD | Admit: 2015-08-16 | Discharge: 2015-08-16 | Disposition: A | Discharge status: Home / Self Care | Payer: Medicaid Other | Source: Ambulatory Visit | Attending: Family Medicine | Admitting: Family Medicine

## 2015-08-16 ENCOUNTER — Inpatient Hospital Stay (HOSPITAL_COMMUNITY)
Admission: AD | Admit: 2015-08-16 | Discharge: 2015-08-16 | Disposition: A | Discharge status: Home / Self Care | Payer: Medicaid Other | Source: Ambulatory Visit | Attending: Obstetrics & Gynecology | Admitting: Obstetrics & Gynecology

## 2015-08-16 DIAGNOSIS — Z3493 Encounter for supervision of normal pregnancy, unspecified, third trimester: Secondary | ICD-10-CM | POA: Insufficient documentation

## 2015-08-16 DIAGNOSIS — Z3A39 39 weeks gestation of pregnancy: Secondary | ICD-10-CM

## 2015-08-16 DIAGNOSIS — R1084 Generalized abdominal pain: Secondary | ICD-10-CM

## 2015-08-16 LAB — POCT FERN TEST: POCT Fern Test: NEGATIVE

## 2015-08-16 MED ORDER — NALBUPHINE HCL 10 MG/ML IJ SOLN
10.0000 mg | Freq: Once | INTRAMUSCULAR | Status: AC
Start: 2015-08-16 — End: 2015-08-16
  Administered 2015-08-16: 10 mg via INTRAMUSCULAR
  Filled 2015-08-16: qty 1

## 2015-08-16 MED ORDER — ACETAMINOPHEN 500 MG PO TABS
500.0000 mg | ORAL_TABLET | Freq: Once | ORAL | Status: AC
  Administered 2015-08-16: 500 mg via ORAL
  Filled 2015-08-16: qty 1

## 2015-08-16 MED ORDER — ZOLPIDEM TARTRATE ER 6.25 MG PO TBCR: 6.2500 mg | EXTENDED_RELEASE_TABLET | Freq: Every evening | ORAL | Status: AC | PRN

## 2015-08-16 NOTE — Progress Notes (Signed)
Dr Natale Milch notified of early dcel

## 2015-08-16 NOTE — MAU Note (Signed)
PT  SAYS  HURT  BAD     AT 1030PM. [facility] -  IN  CLINIC .  VE     1  CM.  DENIES   HSV AND  MRSA.  GBS-UNSURE

## 2015-08-16 NOTE — MAU Note (Signed)
Here last night was 3 cm.  Contractions are closer and stronger.  ? Leaking started this morning.

## 2015-08-16 NOTE — Discharge Instructions (Signed)
Premature Rupture and Preterm Premature Rupture of Membranes °A sac made up of membranes surrounds your baby in the womb (uterus). When this sac breaks before contractions or labor starts, it is called premature rupture of membranes (PROM). Rupture of membranes is also known as your water breaking. If this happens before 37 weeks, it is called preterm premature rupture of membranes (PPROM). PPROM is serious. It needs medical care right away. °CAUSES  °PROM may be caused by the membranes getting weak. This happens at the end of pregnancy. PPROM is often due to an infection, but can be caused by a number of other things.  °SIGNS OF PROM OR PPROM °· A sudden gush of fluid from the vagina. °· A slow leak of fluid from the vagina. °· Your underwear stay wet. °WHAT TO DO IF YOU THINK YOUR WATER BROKE °Call your doctor right away. You will need to go to the hospital to get checked right away. °WHAT HAPPENS IF YOU ARE TOLD YOU HAVE PROM OR PPROM? °You will have tests done at the hospital. If you have PROM, you may be given medicine to start labor (induced). This may happen if you are not having contractions within 24 hours of your water breaking. If you have PPROM and are not having contractions, you may be given medicine to start labor. It will depend on how far along you are in your pregnancy. °If you have PPROM, you: °· And your baby will be watched closely for signs of infection or other problems. °· May be given an antibiotic medicine. This can stop an infection from starting. °· May be given a steroid medicine. This can help the lungs to develop faster. °· May be given a medicine to stop early labor (preterm labor). °· May be told to stay in bed except to use the restroom (bed rest). °· May be given medicine to start labor. This can happen if there are problems with you or the baby. °Your treatment will depend on many factors. °Document Released: 03/01/2009 Document Revised: 08/05/2013 Document Reviewed:  03/24/2013 °ExitCare® Patient Information ©2015 [employer]. This information is not intended to replace advice given to you by your health care provider. Make sure you discuss any questions you have with your health care provider. ° °

## 2015-08-16 NOTE — MAU Provider Note (Signed)
Wendy Ball is a 24 y.o. G2P0010 at [redacted]w[redacted]d I reviewed patient's FHR tracing with fellow Dr. Lyndel Safe and attending Dr. Jaynie Collins. Early decelerations were noted, but given the good variability of the tracing, fetal baseline HR WNL, and that the decelerations were early, not late, we felt that the tracing was reassuring. Will not admit patient at this time. Gave patient IM Nubain before discharge and prescription for Ambien. The patient's nurse discussed term labor precautions with her as well.   Tarri Abernethy, MD PGY-1 [facility]   OB fellow attestation: I have reviewed this fetal heart tracing and discussed the plan of care with the resident. I agree with above documentation in the resident's note.   Federico Flake, MD  OB Fellow, [facility] 6:48 PM

## 2015-08-17 ENCOUNTER — Encounter (HOSPITAL_COMMUNITY): Payer: Self-pay

## 2015-08-17 ENCOUNTER — Other Ambulatory Visit: Payer: Medicaid Other | Admitting: Certified Nurse Midwife

## 2015-08-17 ENCOUNTER — Encounter: Payer: Medicaid Other | Admitting: Advanced Practice Midwife

## 2015-08-17 ENCOUNTER — Inpatient Hospital Stay (HOSPITAL_COMMUNITY): Payer: Medicaid Other | Admitting: Anesthesiology

## 2015-08-17 ENCOUNTER — Inpatient Hospital Stay (HOSPITAL_COMMUNITY)
Admission: AD | Admit: 2015-08-17 | Discharge: 2015-08-19 | DRG: 775 | Disposition: A | Discharge status: Home / Self Care | Payer: Medicaid Other | Source: Ambulatory Visit | Attending: Obstetrics & Gynecology | Admitting: Obstetrics & Gynecology

## 2015-08-17 DIAGNOSIS — IMO0001 Reserved for inherently not codable concepts without codable children: Secondary | ICD-10-CM

## 2015-08-17 DIAGNOSIS — R1084 Generalized abdominal pain: Secondary | ICD-10-CM

## 2015-08-17 DIAGNOSIS — Z3A40 40 weeks gestation of pregnancy: Secondary | ICD-10-CM | POA: Diagnosis present

## 2015-08-17 DIAGNOSIS — O9902 Anemia complicating childbirth: Secondary | ICD-10-CM | POA: Diagnosis present

## 2015-08-17 DIAGNOSIS — O480 Post-term pregnancy: Secondary | ICD-10-CM | POA: Diagnosis present

## 2015-08-17 LAB — TYPE AND SCREEN
ABO/RH(D): B POS
Antibody Screen: NEGATIVE

## 2015-08-17 LAB — CBC
HCT: 34.3 % — ABNORMAL LOW (ref 36.0–46.0)
Hemoglobin: 11.8 g/dL — ABNORMAL LOW (ref 12.0–15.0)
MCH: 30 pg (ref 26.0–34.0)
MCHC: 34.4 g/dL (ref 30.0–36.0)
MCV: 87.3 fL (ref 78.0–100.0)
PLATELETS: 148 10*3/uL — AB (ref 150–400)
RBC: 3.93 MIL/uL (ref 3.87–5.11)
RDW: 15 % (ref 11.5–15.5)
WBC: 21 10*3/uL — ABNORMAL HIGH (ref 4.0–10.5)

## 2015-08-17 LAB — ABO/RH: ABO/RH(D): B POS

## 2015-08-17 LAB — RPR: RPR Ser Ql: NONREACTIVE

## 2015-08-17 MED ORDER — OXYCODONE-ACETAMINOPHEN 5-325 MG PO TABS
1.0000 | ORAL_TABLET | ORAL | Status: DC | PRN
  Administered 2015-08-17: 1 via ORAL
  Filled 2015-08-17: qty 1

## 2015-08-17 MED ORDER — LIDOCAINE HCL (PF) 1 % IJ SOLN
INTRAMUSCULAR | Status: DC | PRN
  Administered 2015-08-17: 4 mL via EPIDURAL
  Administered 2015-08-17: 30 mL
  Administered 2015-08-17: 4 mL via EPIDURAL

## 2015-08-17 MED ORDER — ACETAMINOPHEN 325 MG PO TABS: 650.0000 mg | ORAL_TABLET | ORAL | Status: DC | PRN

## 2015-08-17 MED ORDER — OXYTOCIN 40 UNITS IN LACTATED RINGERS INFUSION - SIMPLE MED: 62.5000 mL/h | INTRAVENOUS | Status: DC | PRN

## 2015-08-17 MED ORDER — DIBUCAINE 1 % RE OINT: 1.0000 "application " | TOPICAL_OINTMENT | RECTAL | Status: DC | PRN

## 2015-08-17 MED ORDER — FENTANYL 2.5 MCG/ML BUPIVACAINE 1/10 % EPIDURAL INFUSION (WH - ANES)
11.0000 mL/h | INTRAMUSCULAR | Status: DC | PRN
  Administered 2015-08-17 (×2): 11 mL/h via EPIDURAL

## 2015-08-17 MED ORDER — LACTATED RINGERS IV SOLN: 500.0000 mL | INTRAVENOUS | Status: DC | PRN

## 2015-08-17 MED ORDER — CITRIC ACID-SODIUM CITRATE 334-500 MG/5ML PO SOLN: 30.0000 mL | ORAL | Status: DC | PRN

## 2015-08-17 MED ORDER — TETANUS-DIPHTH-ACELL PERTUSSIS 5-2.5-18.5 LF-MCG/0.5 IM SUSP: 0.5000 mL | Freq: Once | INTRAMUSCULAR | Status: DC

## 2015-08-17 MED ORDER — IBUPROFEN 600 MG PO TABS
600.0000 mg | ORAL_TABLET | Freq: Four times a day (QID) | ORAL | Status: DC
  Administered 2015-08-17 – 2015-08-19 (×7): 600 mg via ORAL
  Filled 2015-08-17 (×7): qty 1

## 2015-08-17 MED ORDER — ONDANSETRON HCL 4 MG/2ML IJ SOLN: 4.0000 mg | INTRAMUSCULAR | Status: DC | PRN

## 2015-08-17 MED ORDER — ONDANSETRON HCL 4 MG PO TABS: 4.0000 mg | ORAL_TABLET | ORAL | Status: DC | PRN

## 2015-08-17 MED ORDER — MEASLES, MUMPS & RUBELLA VAC ~~LOC~~ INJ
0.5000 mL | INJECTION | Freq: Once | SUBCUTANEOUS | Status: DC
  Filled 2015-08-17: qty 0.5

## 2015-08-17 MED ORDER — LANOLIN HYDROUS EX OINT: TOPICAL_OINTMENT | CUTANEOUS | Status: DC | PRN

## 2015-08-17 MED ORDER — FENTANYL 2.5 MCG/ML BUPIVACAINE 1/10 % EPIDURAL INFUSION (WH - ANES)
14.0000 mL/h | INTRAMUSCULAR | Status: DC | PRN
Start: 2015-08-17 — End: 2015-08-17
  Filled 2015-08-17: qty 125

## 2015-08-17 MED ORDER — LIDOCAINE HCL (PF) 1 % IJ SOLN
30.0000 mL | INTRAMUSCULAR | Status: DC | PRN
  Filled 2015-08-17: qty 30

## 2015-08-17 MED ORDER — WITCH HAZEL-GLYCERIN EX PADS: 1.0000 "application " | MEDICATED_PAD | CUTANEOUS | Status: DC | PRN

## 2015-08-17 MED ORDER — PRENATAL MULTIVITAMIN CH
1.0000 | ORAL_TABLET | Freq: Every day | ORAL | Status: DC
  Administered 2015-08-18 – 2015-08-19 (×2): 1 via ORAL
  Filled 2015-08-17 (×2): qty 1

## 2015-08-17 MED ORDER — OXYTOCIN 40 UNITS IN LACTATED RINGERS INFUSION - SIMPLE MED
62.5000 mL/h | INTRAVENOUS | Status: DC
  Filled 2015-08-17: qty 1000

## 2015-08-17 MED ORDER — OXYCODONE-ACETAMINOPHEN 5-325 MG PO TABS: 2.0000 | ORAL_TABLET | ORAL | Status: DC | PRN

## 2015-08-17 MED ORDER — PHENYLEPHRINE 40 MCG/ML (10ML) SYRINGE FOR IV PUSH (FOR BLOOD PRESSURE SUPPORT): 80.0000 ug | PREFILLED_SYRINGE | INTRAVENOUS | Status: DC | PRN

## 2015-08-17 MED ORDER — EPHEDRINE 5 MG/ML INJ
10.0000 mg | INTRAVENOUS | Status: DC | PRN
  Filled 2015-08-17: qty 2

## 2015-08-17 MED ORDER — ZOLPIDEM TARTRATE 5 MG PO TABS: 5.0000 mg | ORAL_TABLET | Freq: Every evening | ORAL | Status: DC | PRN

## 2015-08-17 MED ORDER — PHENYLEPHRINE 40 MCG/ML (10ML) SYRINGE FOR IV PUSH (FOR BLOOD PRESSURE SUPPORT)
80.0000 ug | PREFILLED_SYRINGE | INTRAVENOUS | Status: DC | PRN
  Filled 2015-08-17: qty 2

## 2015-08-17 MED ORDER — OXYTOCIN BOLUS FROM INFUSION
500.0000 mL | INTRAVENOUS | Status: DC
  Administered 2015-08-17: 500 mL via INTRAVENOUS

## 2015-08-17 MED ORDER — BENZOCAINE-MENTHOL 20-0.5 % EX AERO
1.0000 "application " | INHALATION_SPRAY | CUTANEOUS | Status: DC | PRN
  Administered 2015-08-17 – 2015-08-18 (×2): 1 via TOPICAL
  Filled 2015-08-17 (×2): qty 56

## 2015-08-17 MED ORDER — ONDANSETRON HCL 4 MG/2ML IJ SOLN: 4.0000 mg | Freq: Four times a day (QID) | INTRAMUSCULAR | Status: DC | PRN

## 2015-08-17 MED ORDER — DIPHENHYDRAMINE HCL 50 MG/ML IJ SOLN: 12.5000 mg | INTRAMUSCULAR | Status: DC | PRN

## 2015-08-17 MED ORDER — FENTANYL CITRATE (PF) 100 MCG/2ML IJ SOLN
100.0000 ug | INTRAMUSCULAR | Status: DC | PRN
  Filled 2015-08-17 (×2): qty 2

## 2015-08-17 MED ORDER — FENTANYL CITRATE (PF) 100 MCG/2ML IJ SOLN
100.0000 ug | INTRAMUSCULAR | Status: DC | PRN
  Administered 2015-08-17 (×2): 100 ug via INTRAVENOUS

## 2015-08-17 MED ORDER — SIMETHICONE 80 MG PO CHEW: 80.0000 mg | CHEWABLE_TABLET | ORAL | Status: DC | PRN

## 2015-08-17 MED ORDER — LACTATED RINGERS IV SOLN
INTRAVENOUS | Status: DC
  Administered 2015-08-17 (×3): via INTRAVENOUS

## 2015-08-17 MED ORDER — DIPHENHYDRAMINE HCL 25 MG PO CAPS: 25.0000 mg | ORAL_CAPSULE | Freq: Four times a day (QID) | ORAL | Status: DC | PRN

## 2015-08-17 MED ORDER — SENNOSIDES-DOCUSATE SODIUM 8.6-50 MG PO TABS
2.0000 | ORAL_TABLET | ORAL | Status: DC
  Administered 2015-08-17 – 2015-08-18 (×2): 2 via ORAL
  Filled 2015-08-17 (×2): qty 2

## 2015-08-17 NOTE — H&P (Signed)
LABOR ADMISSION HISTORY AND PHYSICAL  Wendy Ball is a 24 y.o. female G2P0010 with IUP at [redacted]w[redacted]d by early [place] presenting for active labor after onset of contractions around 1am. She reports +FM, + contractions, No LOF, no VB.  She plans on breast/bottle feeding. She is undecided on birth control.  Dating: By 6wk [place] --->  Estimated Date of Delivery: 08/17/15   Clinic [facility] Prenatal Labs  Dating 6 wk [place] Blood type: B/POS/-- (01/25 1346)  Genetic Screen 1 Screen: Normal NT AFP: Sent too late  Antibody:NEG (01/25 1346)  Anatomic [place] Nml @ 19 wks Rubella: 4.40 (01/25 1346)  GTT Early: Third trimester: 114 RPR: NON REAC (06/17 1106)   TDaP vaccine 06/03/15 HBsAg: NEGATIVE (01/25 1346)   Flu vaccine Not documented HIV: NONREACTIVE (06/17 1106)   GBS Negative  GBS:   Contraception Declines - desires quick pregnancy Pap: negative; Jan 2016  Baby Food Bottle   Circumcision Female   Pediatrician undecided   Support Person Phandindra       Prenatal History/Complications:  Past Medical History: Past Medical History  Diagnosis Date  . Medical history non-contributory     Past Surgical History: Past Surgical History  Procedure Laterality Date  . No past surgeries      Obstetrical History: OB History    Gravida Para Term Preterm AB TAB SAB Ectopic Multiple Living   2 0 0 0 1 0 1 0 0 0       Social History: Social History   Social History  . Marital Status: Married    Spouse Name: N/A  . Number of Children: N/A  . Years of Education: N/A   Social History Main Topics  . Smoking status: Never Smoker   . Smokeless tobacco: Never Used  . Alcohol Use: No  . Drug Use: No  . Sexual Activity: Yes    Birth Control/ Protection: None     Comment: last intercourse one week ago   Other Topics Concern  . Not on file   Social History Narrative    Family History: No family history on file.  Allergies: No Known Allergies  Prescriptions prior to  admission  Medication Sig Dispense Refill Last Dose  . cyclobenzaprine (FLEXERIL) 10 MG tablet Take 1 tablet (10 mg total) by mouth every 8 (eight) hours as needed for muscle spasms. (Patient not taking: Reported on 08/10/2015) 30 tablet 0 Not Taking at Unknown time  . oxyCODONE-acetaminophen (PERCOCET/ROXICET) 5-325 MG per tablet Take 1-2 tablets by mouth every 6 (six) hours as needed. (Patient not taking: Reported on 08/10/2015) 10 tablet 0 Not Taking at Unknown time  . prenatal vitamin w/FE, FA (PRENATAL 1 + 1) 27-1 MG TABS tablet Take 1 tablet by mouth daily at 12 noon. 90 each 3 08/15/2015 at Unknown time  . zolpidem (AMBIEN CR) 6.25 MG CR tablet Take 1 tablet (6.25 mg total) by mouth at bedtime as needed for sleep. 3 tablet 0      Review of Systems  All systems reviewed and negative except as stated in HPI  LMP 10/23/2014 General appearance: alert, cooperative and mild distress Lungs: normal work of breathing Heart: regular rate  Abdomen: gravid, soft, non-tender Pelvic: adeqaute Extremities: Homans sign is negative, no sign of DVT, edema Presentation: cephalic Fetal monitoring: Baseline: 145 bpm, Variability: Fair (1-6 bpm), Accelerations: Reactive and Decelerations: Variable: moderate Uterine activity: regular ctxs  Dilation: 5 Effacement (%): 100 Station: -1   Prenatal labs: ABO, Rh: B/POS/-- (01/25 1346) Antibody: NEG (01/25 1346)  Rubella:  Immune RPR: NON REAC (06/17 1106)  HBsAg: NEGATIVE (01/25 1346)  HIV: NONREACTIVE (06/17 1106)  GBS:   Negative 1 hr Glucola 114 Genetic screening normal Anatomy US normal  Prenatal Transfer Tool  Maternal Diabetes: No Genetic Screening: Normal Maternal Ultrasounds/Referrals: Normal Fetal Ultrasounds or other Referrals:  None Maternal Substance Abuse:  No Significant Maternal Medications:  None Significant Maternal Lab Results: Lab values include: Group B Strep negative  Results for orders placed or performed during the  hospital encounter of 08/16/15 (from the past 24 hour(s))  Fern Test   Collection Time: 08/16/15  5:56 PM  Result Value Ref Range   POCT Fern Test Negative = intact amniotic membranes     Patient Active Problem List   Diagnosis Date Noted  . Abdominal pain 07/18/2015  . Supervision of normal first pregnancy 08/12/2013    Assessment: Wendy Ball is a 24 y.o. G2P0010 at [redacted]w[redacted]d here for SOL.   Admit to [employer] #Labor: Expectant management of NSVD #Pain: Labor support without medications #FWB: Category 2 #ID: GBS negative #MOF: Breast/Bottle #MOC: Undecided   Caryl Ada, DO 08/17/2015, 6:29 AM PGY-2, [facility]  OB fellow attestation: I have seen and examined this patient; I agree with above documentation in the resident's note.   Wendy Ball is a 24 y.o. G2P0010 here for SOL PE: BP 118/72 mmHg  Pulse 90  Temp(Src) 97.8 F (36.6 C) (Oral)  Resp 20  LMP 10/23/2014 Gen: calm comfortable, NAD Resp: normal effort, no distress Abd: gravid  ROS, labs, PMH reviewed  Plan: Admit to [facility] #Labor: Expectant management of NSVD #Pain: Labor support without medications #FWB: Category 2 #ID: GBS negative #MOF: Breast/Bottle #MOC: Undecided  Federico Flake, MD Family Medicine, OB Fellow 08/17/2015, 9:03 AM

## 2015-08-17 NOTE — MAU Note (Signed)
Patient came in by EMS complaining of pain 10/10, contracting since 0100, 5 minutes apart, not sure if water broke, no bleeding. Pt wants IV pain meds.

## 2015-08-17 NOTE — Anesthesia Preprocedure Evaluation (Signed)
Anesthesia Evaluation  Patient identified by MRN, date of birth, ID band Patient awake    Reviewed: Allergy & Precautions, Patient's Chart, lab work & pertinent test results  Airway Mallampati: II  TM Distance: >3 FB Neck ROM: Full    Dental no notable dental hx. (+) Teeth Intact   Pulmonary neg pulmonary ROS,  breath sounds clear to auscultation  Pulmonary exam normal       Cardiovascular negative cardio ROS Normal cardiovascular examRhythm:Regular Rate:Normal     Neuro/Psych negative neurological ROS  negative psych ROS   GI/Hepatic negative GI ROS, Neg liver ROS,   Endo/Other    Renal/GU negative Renal ROS  negative genitourinary   Musculoskeletal negative musculoskeletal ROS (+)   Abdominal   Peds  Hematology  (+) anemia , Thrombocytopenia   Anesthesia Other Findings   Reproductive/Obstetrics (+) Pregnancy                             Anesthesia Physical Anesthesia Plan  ASA: II  Anesthesia Plan: Epidural   Post-op Pain Management:    Induction:   Airway Management Planned: Natural Airway  Additional Equipment:   Intra-op Plan:   Post-operative Plan:   Informed Consent: I have reviewed the patients History and Physical, chart, labs and discussed the procedure including the risks, benefits and alternatives for the proposed anesthesia with the patient or authorized representative who has indicated his/her understanding and acceptance.     Plan Discussed with: Anesthesiologist  Anesthesia Plan Comments:         Anesthesia Quick Evaluation

## 2015-08-17 NOTE — Anesthesia Procedure Notes (Signed)
Epidural Patient location during procedure: OB Start time: 08/17/2015 11:20 AM  Staffing Anesthesiologist: Mal Amabile Performed by: anesthesiologist   Preanesthetic Checklist Completed: patient identified, site marked, surgical consent, pre-op evaluation, timeout performed, IV checked, risks and benefits discussed and monitors and equipment checked  Epidural Patient position: sitting Prep: site prepped and draped and DuraPrep Patient monitoring: continuous pulse ox and blood pressure Approach: midline Location: L3-L4 Injection technique: LOR air  Needle:  Needle type: Tuohy  Needle gauge: 17 G Needle length: 9 cm and 9 Needle insertion depth: 4 cm Catheter type: closed end flexible Catheter size: 19 Gauge Catheter at skin depth: 9 cm Test dose: negative and Other  Assessment Events: blood not aspirated, injection not painful, no injection resistance, negative IV test and no paresthesia  Additional Notes Patient identified. Risks and benefits discussed including failed block, incomplete  Pain control, post dural puncture headache, nerve damage, paralysis, blood pressure Changes, nausea, vomiting, reactions to medications-both toxic and allergic and post Partum back pain. All questions were answered. Patient expressed understanding and wished to proceed. Sterile technique was used throughout procedure. Epidural site was Dressed with sterile barrier dressing. No paresthesias, signs of intravascular injection Or signs of intrathecal spread were encountered.  Patient was more comfortable after the epidural was dosed. Please see RN's note for documentation of vital signs and FHR which are stable.

## 2015-08-18 MED ORDER — OXYCODONE-ACETAMINOPHEN 5-325 MG PO TABS
1.0000 | ORAL_TABLET | ORAL | Status: DC | PRN
  Administered 2015-08-18 – 2015-08-19 (×4): 1 via ORAL
  Filled 2015-08-18 (×4): qty 1

## 2015-08-18 NOTE — Progress Notes (Signed)
Post Partum Day 1 Subjective: no complaints, up ad lib, voiding and tolerating PO  Objective: Blood pressure 105/66, pulse 85, temperature 97.8 F (36.6 C), temperature source Oral, resp. rate 18, height 5' (1.524 m), weight 140 lb (63.504 kg), last menstrual period 10/23/2014, SpO2 100 %, unknown if currently breastfeeding.  Physical Exam:  General: alert and no distress Lochia: appropriate Uterine Fundus: firm Incision: healing well DVT Evaluation: No evidence of DVT seen on physical exam.   Recent Labs  08/17/15 0625  HGB 11.8*  HCT 34.3*    Assessment/Plan: Plan for discharge tomorrow and Contraception pt declines   LOS: 1 day   HARRAWAY-SMITH, Giara Mcgaughey 08/18/2015, 5:03 PM

## 2015-08-18 NOTE — Progress Notes (Signed)
Referred to pt by RN who states that their baby had some respiratory issues at birth and is in NICU.  Introduced myself and Publishing rights manager.  Family shared that they were confused about their daughter, Wendy Ball's, need to be in the NICU and worried about her.  Explained how chaplain's could be supportive and FOB stated that he felt like he was bothering the medical team when asking questions but that he really didn't understand what was happening and felt they were using medical terminology that didn't make sense to him.  Offered to support them in visiting their daughter and perhaps help them advocate for themselves in getting their questions asked so that they might better understand what's going on.  FOB will have RN page chaplain when they are ready to visit Wendy Ball.  Wendy Ball, M.Div [facility] Chaplain, [phone]

## 2015-08-18 NOTE — Progress Notes (Signed)
Received page from pt's nurse notifying me that the family was ready to visit their daughter.  Met family in NICU where they were holding her.  Explained to Isa's RN that the family is confused about what is going on with her and wanted some support in visiting her and hopefully asking the doctor questions about her situation and care.  RN requested neonatologist to visit with family.  RN told this Probation officer that they had actually had several opportunities to get an explanation and ask questions and she was uncertain where the confusion was.  Mr Harb stated that his family wants to know why the baby isn't with them.  After neonatologist rounded it became apparent that some of the confusion was around Isa's presentation changing a few times after delivery-initially due to respiratory distress, then circulation, and then abnormal labwork.  As neonatologist explained Isa's current status, writer encouraged family to ask questions and made sure that they understood what was being explained.  We processed the information and father seemed to verbalize a greater understanding.  Medical team encouraged mother to nurse baby as she seemed to want to suck and had been having some lower blood sugar and decreased urine output.  Mother initially appeared resistant stating perhaps she could pump, but did seem encouraged by RNs telling her that being allowed to breast feed the baby was a good sign for a NICU baby.  With the assurance that we would provide her with screens, she was willing to try.  She still expressed some hesitation and anxiety around nursing.  We discussed the difficulties of doing all of the first time parent tasks in a very unfamiliar and somewhat scary setting and mother said that a lactation consultant would be encouraging.  Helped set privacy screens up and requested secretary contact lactation consultant for mother who was grateful.    [address] Buddy Duty, [place], [place]   08/18/15 1000   Clinical Encounter Type  Visited With Patient and family together  Visit Type Follow-up;Spiritual support;Social support  Referral From Patient;Nurse  Consult/Referral To Chaplain;Nurse  Spiritual Encounters  Spiritual Needs Emotional

## 2015-08-18 NOTE — Lactation Note (Signed)
This note was copied from the chart of Wendy Sophi Calligan. Lactation Consultation Note  Initial visit made in the NICU.  Baby is 15 hours old.  This is mom's first baby.  Assisted with latching baby to left breast using football hold.  Mom easily able to express colostrum.  Baby latched easily and nursed well.  Baby then put on the right breast using cross cradle hold.  Baby latched easily and nursed for 10 more minutes before coming off the breast sleeping.  I will follow up with mom in her room to assist with pumping.  Discussed the presence of colostrum and milk coming to volume.  Patient Name: Wendy Ball [id number] Date: 08/18/2015 Reason for consult: Initial assessment;NICU baby   Maternal Data    Feeding Feeding Type: Breast Fed Length of feed: 20 min  LATCH Score/Interventions Latch: Grasps breast easily, tongue down, lips flanged, rhythmical sucking.  Audible Swallowing: A few with stimulation Intervention(s): Skin to skin;Hand expression;Alternate breast massage  Type of Nipple: Everted at rest and after stimulation  Comfort (Breast/Nipple): Soft / non-tender     Hold (Positioning): Assistance needed to correctly position infant at breast and maintain latch. Intervention(s): Breastfeeding basics reviewed;Support Pillows;Position options;Skin to skin  LATCH Score: 8  Lactation Tools Discussed/Used     Consult Status Consult Status: Follow-up Date: 08/19/15 Follow-up type: In-patient    [name] 08/18/2015, 11:20 AM

## 2015-08-19 NOTE — Lactation Note (Signed)
This note was copied from the chart of Wendy Ball. Lactation Consultation Note  Follow up visit made.  Mom is both pumping and putting baby to breast.  Reviewed importance of pumping anytime baby doesn't go to breast and when she is away from baby.  Mom plans to pick up pump from [facility] today after discharge.  Patient Name: Wendy Ball [id number] Date: 08/19/2015     Maternal Data    Feeding Feeding Type: Formula Nipple Type: Slow - flow Length of feed: 30 min  LATCH Score/Interventions                      Lactation Tools Discussed/Used     Consult Status      [name] 08/19/2015, 1:01 PM

## 2015-08-19 NOTE — Discharge Instructions (Signed)

## 2015-08-19 NOTE — Clinical Social Work Maternal (Signed)
CLINICAL SOCIAL WORK MATERNAL/CHILD NOTE  Patient Details  Name: Wendy Ball MRN: [id number] Date of Birth: 02/02/1991  Date:  08/19/2015  Clinical Social Worker Initiating Note:  Sage Hammill E. 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.")   Education:      Financial Resources:  Medicaid   Other Resources:  WIC   Cultural/Religious Considerations Which May Impact Care: Hindu  Strengths:  Ability to meet basic needs , Other (Comment) (CSW made referral to [facility] for baby supplies and gave a pediatrician list.)   Risk Factors/Current Problems:  None   Cognitive State:  Alert , Linear Thinking , Goal Oriented , Insightful    Mood/Affect:  Interested , Calm    CSW Assessment: CSW met with parents in MOB's third floor room/304 to introduce services, offer support and complete assessment due to baby's admission to NICU.  FOB immediately stated that they do not want to leave the hospital without their baby.  CSW validated their feelings of not wanting to be separated and discussed that baby's admission to NICU was not desired or planned, but is necessary and temporary.  FOB reports that they live "far from the hospital" on [address]  He states he has a car, but has to work and that MOB can't drive due to her recent delivery.  CSW encouraged them to ask MOB's doctor if there are restrictions on driving after a vaginal delivery.  FOB then stated that he did not want  MOB driving his car because she is a "new driver."  CSW informed them of the availability of bus passes if needed and helped them brainstorm support people MOB may be able to call for transportation to and from the hospital when FOB is working.  CSW explained that unfortunately the NICU does not have the space for parents to stay throughout baby's hospitalization and provided information about rooming in the night before discharge if they wish.   CSW inquired about preparations they have made at home for baby.  FOB replied that MOB has bought to outfits, "that's it."  MOB reports not getting diapers because she did not know how big the baby would be.  CSW offered to get baby basics from [facility].  They agreed and were appreciative.  CSW asked them, "where will the baby sleep once she is home?"  FOB informed CSW that they plan to sleep with baby in their bed.  CSW acknowledged their right as parents to make decisions for their infant, but stated a responsibility to informed them of SIDS risks/precautions.  Parents were attentive.  They are accepting of a bassinet from [facility] and state an understanding of safe sleep education given.   CSW discussed common emotions often experienced in the first two weeks following delivery as well as signs and symptoms of perinatal mood   disorders.  Parents were attentive to information given.  CSW explained ongoing support services offered by NICU CSW and gave contact information. CSW made referral to [facility] for supplies and left a pediatrician list at baby's bedside.  CSW Plan/Description:  Patient/Family Education , Information/Referral to Community Resources , Psychosocial Support and Ongoing Assessment of Needs    Redell Nazir Elizabeth, LCSW 08/19/2015, 3:37 PM 

## 2015-08-19 NOTE — Progress Notes (Signed)
Pt is discharged in the care of husband,with N.T. Escort. Denies any pain or discomfort. Spirits are good. Abdominal dressing is clean and dry .Denies any heavy vaginal bleeding. Understands all instructions well  Questions asked and answered. No equipment needed for home use.

## 2015-08-19 NOTE — Discharge Summary (Signed)
    OB Discharge Summary  Patient Name: Wendy Ball DOB: 1991-02-10 MRN: [id number]  Date of admission: 08/17/2015 Delivering MD: Shonna Chock B[place]   Date of discharge:08/19/2015  Admitting diagnosis: Onset of Labor   Intrauterine pregnancy: [redacted]w[redacted]d     Secondary diagnosis: None     Discharge diagnosis: Term Pregnancy Delivered                                                                                            Post partum procedures:none  Augmentation: AROM  Complications:None  Hospital course:  Onset of Labor With Vaginal Delivery     24 y.o. yo G2P1011 at [redacted]w[redacted]d was admitted in Active Laboron 08/17/2015. Patient had an uncomplicated labor course as follows:  Intrapartum Procedures: Episiotomy: None [1]                                         Lacerations:  2nd degree [3];Perineal [11]  Mediations and procedures used include: N/A  Patient had a delivery of a Viable infant. 08/17/2015  Information for the patient's newborn:  Virgin, Zellers [[id number]]  Delivery Method: Vaginal, Spontaneous Delivery (Filed from Delivery Summary)    Pateint had an uncomplicated postpartum course.  She is ambulating, tolerating a regular diet, passing flatus, and urinating well. Patient is discharged home in stable condition on No discharge date for patient encounter.Marland Kitchen    Physical exam  Filed Vitals:   08/18/15 1227 08/18/15 1711 08/18/15 2108 08/19/15 0508  BP: 105/66 106/68 93/55 93/57   Pulse: 85 79 85 78  Temp: 97.8 F (36.6 C)  98 F (36.7 C) 97.9 F (36.6 C)  TempSrc: Oral  Oral Oral  Resp: Height:      Weight:      SpO2: 100% 99% 100% 100%   General: alert, cooperative and no distress Lochia: appropriate Uterine Fundus: firm DVT Evaluation: No evidence of DVT seen on physical exam. Negative Homan's sign. Labs: Lab Results  Component Value Date   WBC 21.0* 08/17/2015   HGB 11.8* 08/17/2015   HCT 34.3* 08/17/2015   MCV 87.3 08/17/2015   PLT 148*  08/17/2015   No flowsheet data found.  Discharge instruction: per After Visit Summary and "Baby and Me Booklet".  Medications:   Medication List    TAKE these medications        prenatal vitamin w/FE, FA 27-1 MG Tabs tablet  Take 1 tablet by mouth daily at 12 noon.     zolpidem 6.25 MG CR tablet  Commonly known as:  AMBIEN CR  Take 1 tablet (6.25 mg total) by mouth at bedtime as needed for sleep.        Diet: routine diet  Activity: Advance as tolerated. Pelvic rest for 6 weeks.   Outpatient follow up:6 weeks  Postpartum contraception: None  Newborn Data: Live born female  Birth Weight: 7 lb 13.2 oz (3549 g) APGAR: 3, 6  Baby Feeding: Breast Disposition:NICU   08/19/2015 Levie Heritage, DO

## 2015-08-19 NOTE — Progress Notes (Signed)
Pt is discharged in the care of  Husband,with N.T. Escort. Denies any pain or discomfort. Understands all instructions well. Questions were asked and answered. No equipment needed for home used. Stable.

## 2015-09-23 ENCOUNTER — Encounter: Payer: Self-pay | Admitting: Medical

## 2015-09-23 ENCOUNTER — Ambulatory Visit (INDEPENDENT_AMBULATORY_CARE_PROVIDER_SITE_OTHER): Payer: Medicaid Other | Admitting: Medical

## 2015-09-23 NOTE — Patient Instructions (Signed)
Vaginal Laceration °A vaginal laceration is a tear in the vaginal wall. A vaginal tear falls into one of three categories:  °1. Obstetrically related tears that occur at the time of childbirth. °2. Trauma-related tears (most often related to sexual intercourse). °3. Spontaneous tears. °Vaginal tears can cause heavy bleeding (hemorrhaging) depending on severity of the tear. Tears can be intensely tender and interfere with normal activities of living. They can make sexual intercourse painful and bring on significant burning with urination. If you have a vaginal laceration, the area around your vagina may be painful when you touch or wipe it. Even light pressure from clothing may cause some pain. Vaginal tear need to be evaluated by your caregiver.   °CAUSES  °· Obstetric-related causes, such as childbirth. °· Trauma that may result from an accident during an activity, such as sexual intercourse or a bicycle ride. °· Spontaneous causes related to aging, failed healing of a past obstetric tear, chronic irritation, or skin changes that are not well understood. °SYMPTOMS  °· Slight to heavy vaginal bleeding. °· Vaginal swelling. °· Mild to severe pain. °· Vaginal tenderness. °DIAGNOSIS  °If the tear happened during childbirth, your caregiver can diagnose the tear at that time. To diagnose a vaginal tear that happened spontaneously or because of trauma, your caregiver will perform a physical exam. During the physical exam, your caregiver may also look for any signs of trouble that may need further testing. If there is hemorrhaging, your caregiver may suggest blood tests to determine the extent of bleeding. Imaging tests may be performed, such as an ultrasonography or computed tomography (CT), to look for internal damage. A biopsy may be need if there are signs of a more serious problem.  °TREATMENT  °Treatment depends on the severity of the tear. For minor tears that heal on their own, treatment may only consist of keeping  the area clean and dry. Some tears need to be repaired with stitches. Other tears may heal on their own with help from various remedies, such as antibiotic ointments, medicated creams, or petroleum products. Depending on the circumstances, oral hormones may also be suggested. Hormone remedies may also be in the form of topical creams and vaginal tablets. For more concerning situations, hospitalization and surgical repair of the tear may be needed. °HOME CARE INSTRUCTIONS  °· Take warm-water baths that cover your hips and buttocks (sitz bath) 2 to 3 times a day. This may help any discomfort and swelling.   °· Only take over-the-counter or prescription medicines for pain, discomfort, or fever as directed by your caregiver. Do not use aspirin because it can cause increased bleeding.   °· Do not douche, use tampons, or have intercourse until your caregiver says it is okay.    °· A bandage (dressing) may have been applied. Change the dressing once a day or as directed. If the dressing sticks, soak it off with warm, soapy water.   °· Apply ice or witch hazel pads to the vagina to lessen any pain or discomfort.   °· Take a stool softener or follow a special diet as directed by your caregiver. This will help ease discomfort associated with bowel movements.   °SEEK IMMEDIATE MEDICAL CARE IF:  °· You have redness or swelling in the vaginal area.   °· You have increasing, sharp, or intense pain or tenderness in the vaginal area. °· You have pus or unusual discharge coming from the tear or vagina.   °· You notice a bad smell coming from the vagina.   °· Your tear breaks open after   it healed or was repaired.   °· You feel lightheaded. °· You have increasing abdominal pain.   °· You have an increasing or heavy amount of vaginal bleeding.   °· You have pain with intercourse after the tear heals.   °MAKE SURE YOU: °· Understand these instructions. °· Will watch your condition. °· Will get help right away if you are not doing well  or get worse. °  °This information is not intended to replace advice given to you by your health care provider. Make sure you discuss any questions you have with your health care provider. °  °Document Released: 12/03/2005 Document Revised: 08/27/2012 Document Reviewed: 04/21/2012 °Elsevier Interactive Patient Education ©2016 [employer] ° °

## 2015-09-23 NOTE — Progress Notes (Signed)
Patient ID: Wendy Ball, female   DOB: 04/04/1991, 24 y.o.   MRN: [id number] Subjective:     Wendy Ball is a 24 y.o. female who presents for a postpartum visit. She is 5 weeks postpartum following a spontaneous vaginal delivery. I have fully reviewed the prenatal and intrapartum course. The delivery was at 40 gestational weeks. Outcome: spontaneous vaginal delivery. Anesthesia: epidural. Postpartum course has been unremarkable. Baby's course has been unremarkable. Baby is feeding by bottle - Similac Advance. Bleeding staining only. Bowel function is abnormal: constipation. Bladder function is normal. Patient is not sexually active. Contraception method is none. Postpartum depression screening: negative. Patient had 2nd degree laceration and continues to have discomfort.   The following portions of the patient's history were reviewed and updated as appropriate: allergies, current medications, past family history, past medical history, past social history, past surgical history and problem list.  Review of Systems Pertinent items are noted in HPI.   Objective:    BP 103/68 mmHg  Pulse 60  Temp(Src) 98.1 F (36.7 C)  Ht  (1.575 m)  Wt 122 lb 8 oz (55.566 kg)  BMI 22.40 kg/m2  Breastfeeding? No  General:  alert and cooperative   Breasts:  not performed  Lungs: clear to auscultation bilaterally  Heart:  regular rate and rhythm, S1, S2 normal, no murmur, click, rub or gallop  Abdomen: soft, non-tender; bowel sounds normal; no masses,  no organomegaly   Vulva:  positive for small area of healing tissue following 2nd degree laceration from delivery with scant blood  Vagina: not evaluated  Cervix:  not evaluated  Corpus: normal size, contour, position, consistency, mobility, non-tender  Adnexa:  normal adnexa  Rectal Exam: Not performed.         MDM Consulted with Dr. Jolayne Panther. She has evaluated the area of concern. She feels that this will heal without need for intervention at this  point. Patient encouraged to increased dietary protein.  Assessment:     Normal postpartum exam. Pap smear not done at today's visit.  Last pap smear 12/2014 was normal  Plan:    1. Contraception: condoms 2. Follow up in: 1 year  or as needed.

## 2015-10-02 IMAGING — US US OB COMP +14 WK
1 series · 12 of 28 positions shown · non-contrast
Comparison: none

[Series 1: us ob +14 all · 12 of 83 slices shown]
[im 4/83]
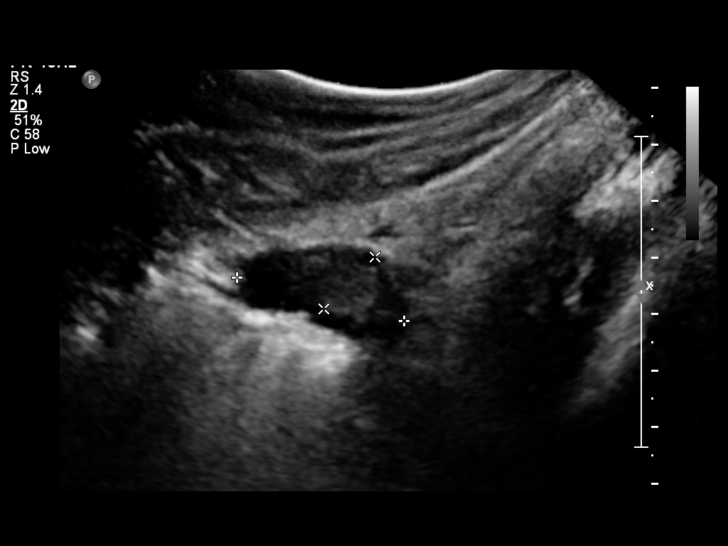
[im 10/83]
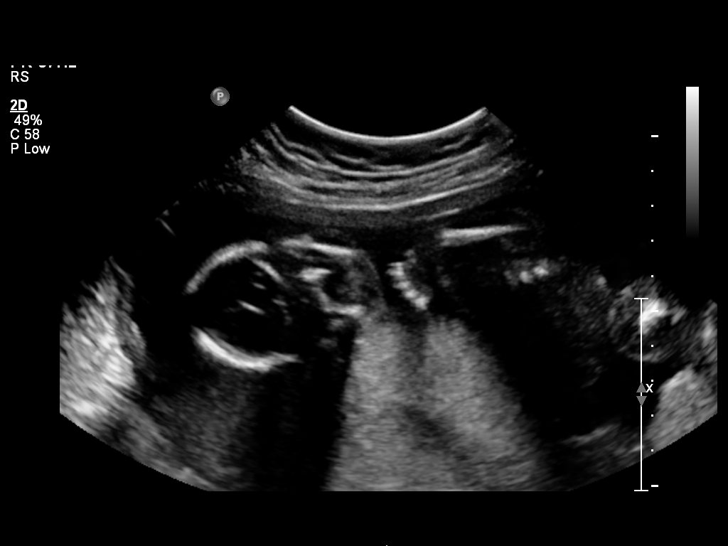
[im 16/83]
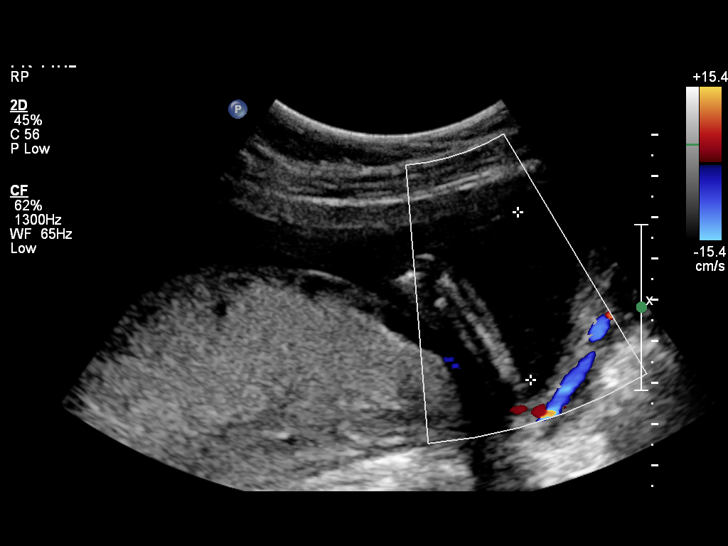
[im 25/83]
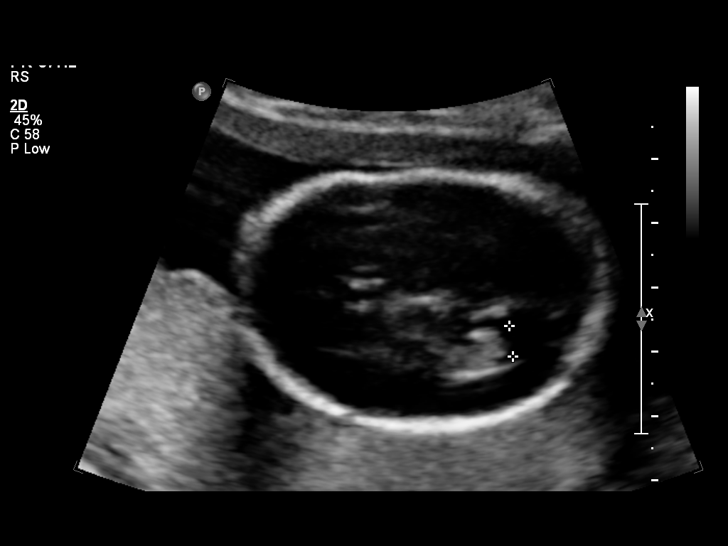
[im 31/83]
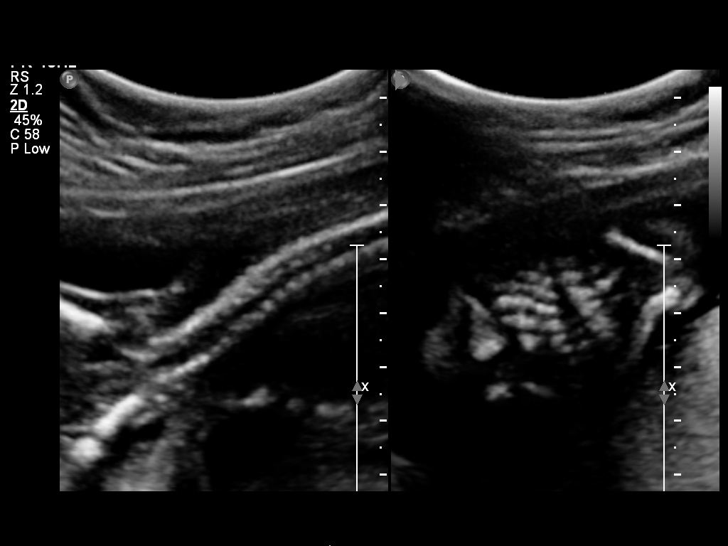
[im 37/83]
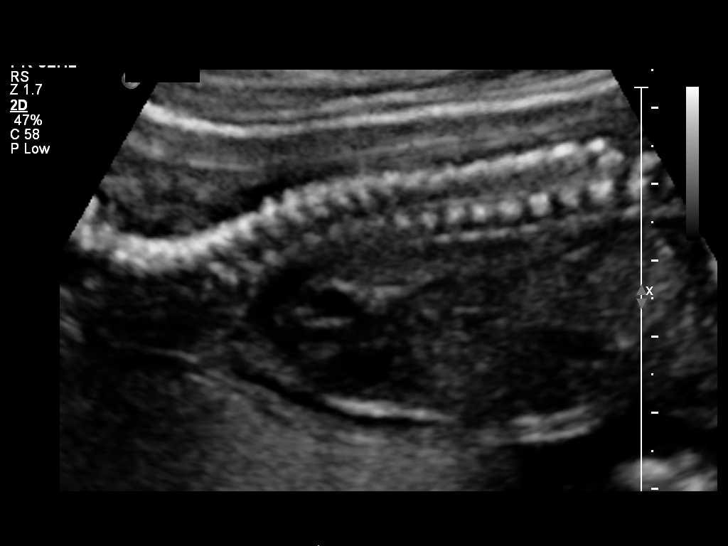
[im 46/83]
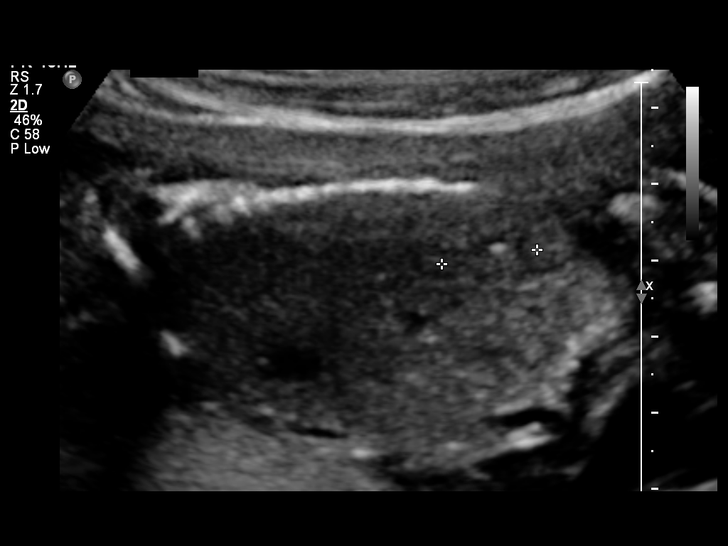
[im 52/83]
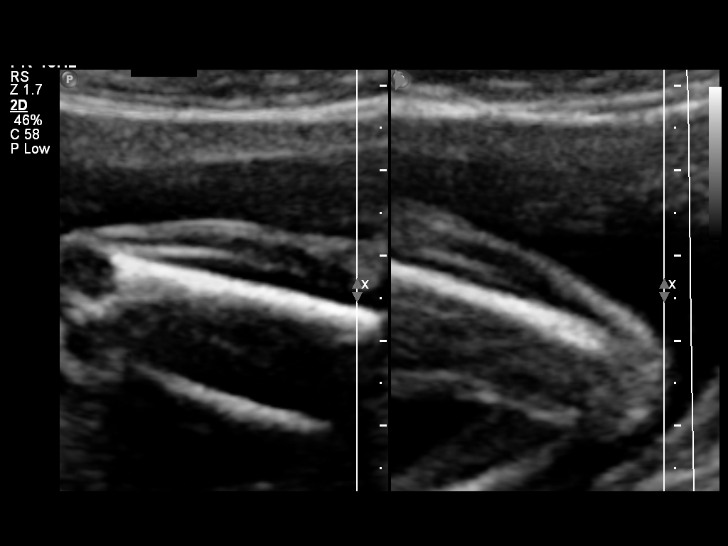
[im 58/83]
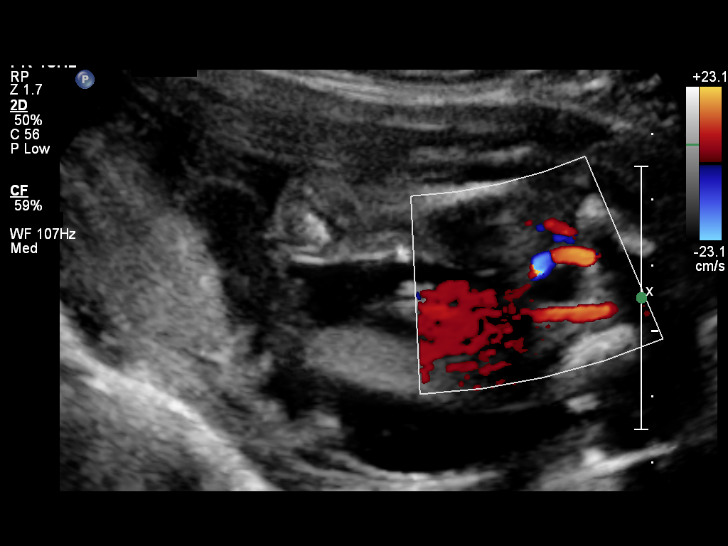
[im 67/83]
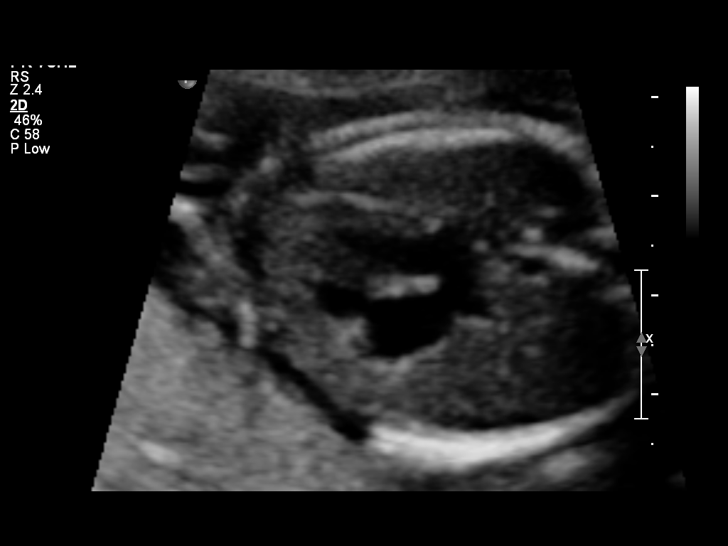
[im 73/83]
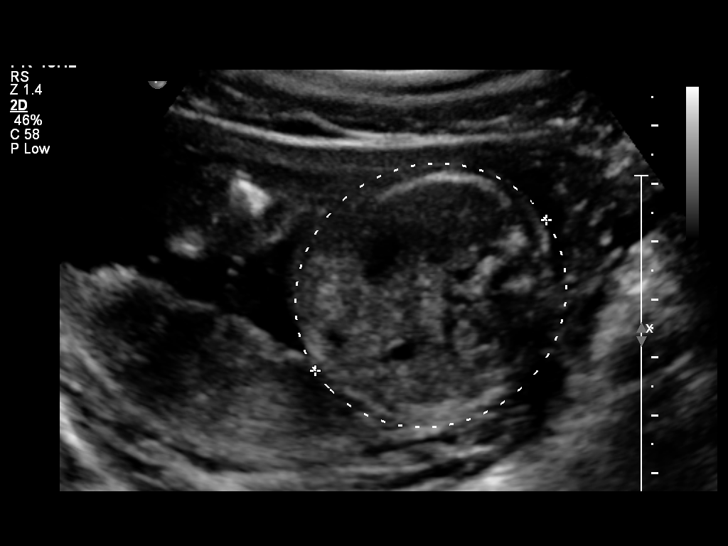
[im 79/83]
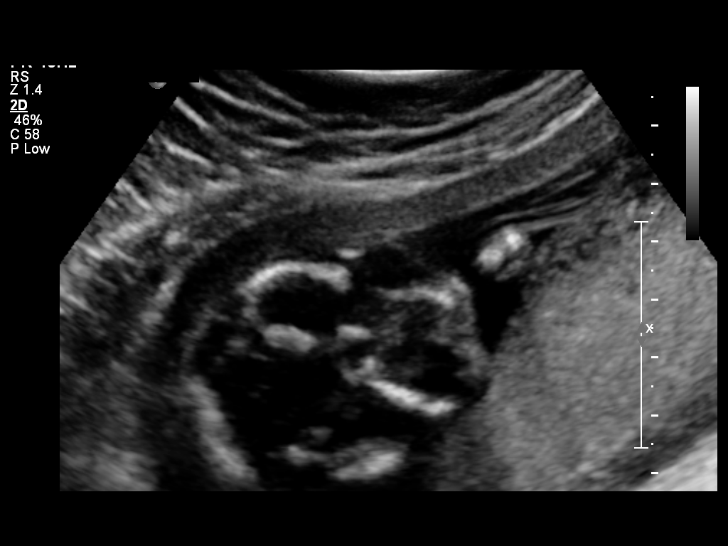

[12 of 28 positions shown; findings below may reference images not displayed]

OBSTETRICS REPORT
                      (Signed Final 03/25/2015 [DATE])

                                                         PA
Service(s) Provided

 US OB COMP + 14 WK                                    76805.1
Indications

 Basic anatomic survey                                 z36
 19 weeks gestation of pregnancy
Fetal Evaluation

 Num Of Fetuses:    1
 Fetal Heart Rate:  136                          bpm
 Cardiac Activity:  Observed
 Presentation:      Breech
 Placenta:          Posterior, above cervical
                    os
 P. Cord            Visualized
 Insertion:

 Amniotic Fluid
 AFI FV:      Subjectively within normal limits
                                             Larg Pckt:     4.1  cm
Biometry

 BPD:     38.9  mm     G. Age:  17w 6d                CI:        62.62   70 - 86
                                                      FL/HC:      20.3   16.1 -

 HC:     158.6  mm     G. Age:  18w 5d       19  %    HC/AC:      1.07   1.09 -

 AC:     148.4  mm     G. Age:  20w 1d       72  %    FL/BPD:
 FL:      32.2  mm     G. Age:  20w 0d       69  %    FL/AC:      21.7   20 - 24
 HUM:     31.3  mm     G. Age:  20w 3d       81  %
 CER:     18.6  mm     G. Age:  18w 2d       22  %
 NFT:     4.77  mm

 Est. FW:     317  gm    0 lb 11 oz      55  %
Gestational Age

 LMP:           21w 6d        Date:  10/23/14                 EDD:   07/30/15
 U/S Today:     19w 1d                                        EDD:   08/18/15
 Best:          19w 2d     Det. By:  Early Ultrasound         EDD:   08/17/15
                                     (12/21/14)
Anatomy

 Cranium:          Appears normal         Aortic Arch:      Appears normal
 Fetal Cavum:      Appears normal         Ductal Arch:      Appears normal
 Ventricles:       Appears normal         Diaphragm:        Not well visualized
 Choroid Plexus:   Appears normal         Stomach:          Appears normal, left
                                                            sided
 Cerebellum:       Appears normal         Abdomen:          Appears normal
 Posterior Fossa:  Appears normal         Abdominal Wall:   Appears nml (cord
                                                            insert, abd wall)
 Nuchal Fold:      Appears normal         Cord Vessels:     Appears normal (3
                                                            vessel cord)
 Face:             Appears normal         Kidneys:          Appear normal
                   (orbits and profile)
 Lips:             Appears normal         Bladder:          Appears normal
 Heart:            Appears normal         Spine:            Appears normal
                   (4CH, axis, and
                   situs)
 RVOT:             Appears normal         Lower             Appears normal
                                          Extremities:
 LVOT:             Appears normal         Upper             Appears normal
                                          Extremities:

 Other:  Fetus appears to be a female. 5th digit visualized. Technically difficult
         due to fetal position.
Targeted Anatomy

 Fetal Central Nervous System
 Cisterna Magna:
Cervix Uterus Adnexa

 Cervical Length:    2.96     cm

 Cervix:       Normal appearance by transabdominal scan.

 Left Ovary:    Size(cm) L: 1.47 x W: 1.23 x H: 0.63  Volume(cc):
 Right Ovary:   Size(cm) L: 3.04 x W: 1.89 x H: 1.28  Volume(cc):
Impression

 SIUP at 19+2 weeks
 Normal detailed fetal anatomy
 Markers of aneuploidy: none
 Normal amniotic fluid volume
 Measurements consistent with prior US
Recommendations

 Follow-up as clinically indicated

 questions or concerns.

## 2015-11-02 ENCOUNTER — Other Ambulatory Visit: Payer: Self-pay | Admitting: Obstetrics and Gynecology

## 2015-11-02 ENCOUNTER — Ambulatory Visit (INDEPENDENT_AMBULATORY_CARE_PROVIDER_SITE_OTHER): Payer: Medicaid Other | Admitting: General Practice

## 2015-11-02 DIAGNOSIS — N390 Urinary tract infection, site not specified: Secondary | ICD-10-CM | POA: Diagnosis not present

## 2015-11-02 LAB — POCT URINALYSIS DIP (DEVICE)
BILIRUBIN URINE: NEGATIVE
Glucose, UA: NEGATIVE mg/dL
KETONES UR: NEGATIVE mg/dL
NITRITE: NEGATIVE
PH: 5 (ref 5.0–8.0)
PROTEIN: NEGATIVE mg/dL
Specific Gravity, Urine: 1.025 (ref 1.005–1.030)
UROBILINOGEN UA: 0.2 mg/dL (ref 0.0–1.0)

## 2015-11-02 MED ORDER — SULFAMETHOXAZOLE-TRIMETHOPRIM 400-80 MG PO TABS: 1.0000 | ORAL_TABLET | Freq: Two times a day (BID) | ORAL | Status: AC

## 2015-11-02 MED ORDER — PHENAZOPYRIDINE HCL 200 MG PO TABS: 200.0000 mg | ORAL_TABLET | Freq: Three times a day (TID) | ORAL | Status: AC | PRN

## 2015-11-02 NOTE — Addendum Note (Signed)
Addended by: Kathee DeltonHILLMAN, Jonmarc Bodkin L on: 11/02/2015 11:21 AM   Modules accepted: Orders

## 2015-11-02 NOTE — Progress Notes (Signed)
Patient here today for UA for possible UTI. Patient reports pelvic pain & dysuria when she urinates. Told patient we will send her urine for culture but send in an antibiotics to start in the mean time. Patient states she is not breastfeeding. Told patient we will also send in a prescription for bladder spasms or pain. Patient verbalized understanding to all and is aware cultures take a few days to come back. Patient had no other questions

## 2015-11-03 LAB — URINE CULTURE
COLONY COUNT: NO GROWTH
ORGANISM ID, BACTERIA: NO GROWTH

## 2015-11-22 ENCOUNTER — Telehealth: Payer: Self-pay | Admitting: *Deleted

## 2015-11-22 NOTE — Telephone Encounter (Signed)
Wendy Ball called and left a message she is calling about results.  Called Wendy Ball and she is wanting to know results of her urine culture. Informed her urine culture was negative. She did not have a uti.  She states she is having a white vaginal discharge.  She states it has a bad smell, denies any vaginal itching or pain. States her stitches are still sore. I clairified vaginal discharge is not related to urine.  I offered her an appointment to check stitch area and / or vaginal discharge. She declined an appointment. States they told her it would take a while for stitch area to heal. I advised her to call back for an appointment if soreness continues or if discharge doesn't get better or changes color, amount, etc.  She voices understanding.

## 2016-02-09 IMAGING — US US RENAL
1 series · 14 of 25 positions shown · non-contrast
Comparison: None.

CLINICAL DATA: Third trimester pregnancy, abdominal pain right
lower quadrant and right flank

EXAM:
RENAL / URINARY TRACT ULTRASOUND COMPLETE

[Series 1: us renal · 14 of 37 slices shown]
[im 1/37]
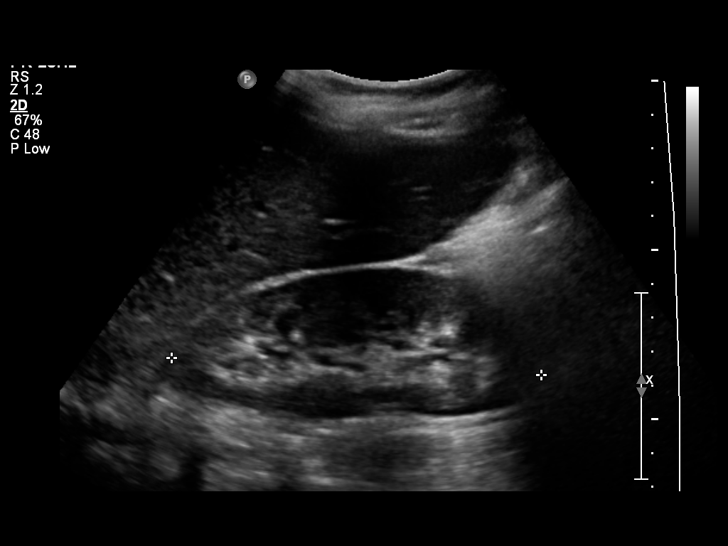
[im 4/37]
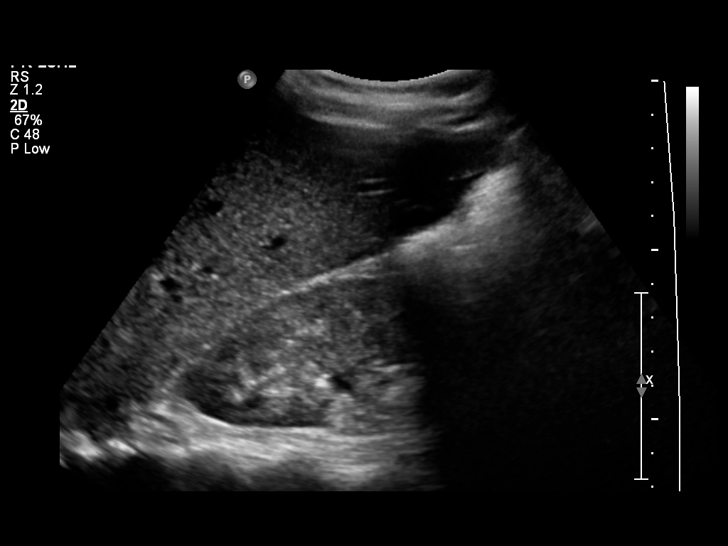
[im 7/37]
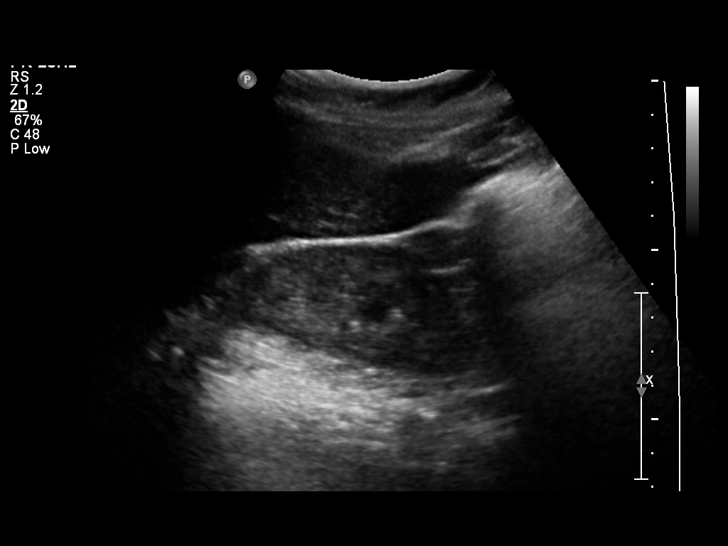
[im 10/37]
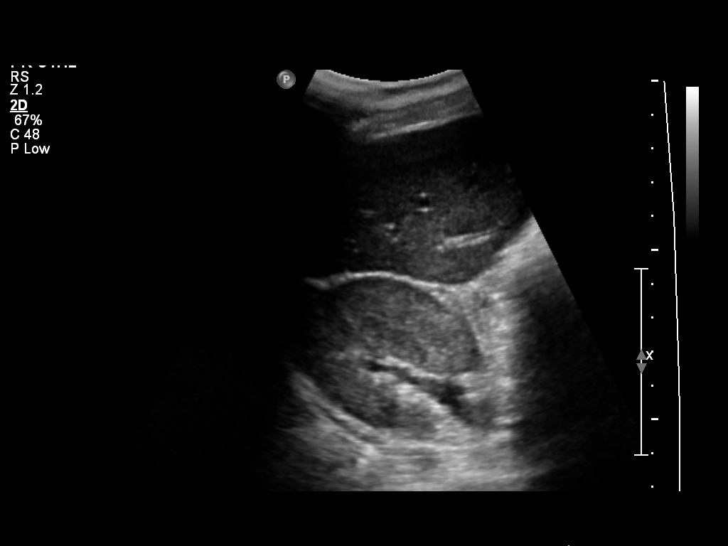
[im 13/37]
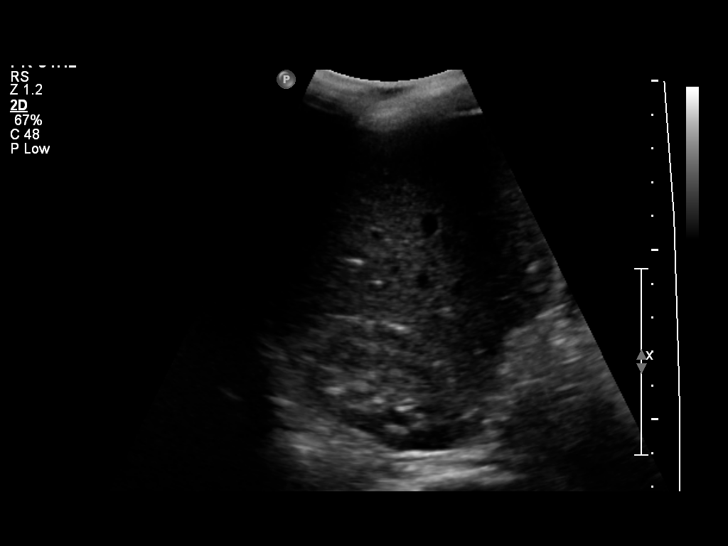
[im 14/37]
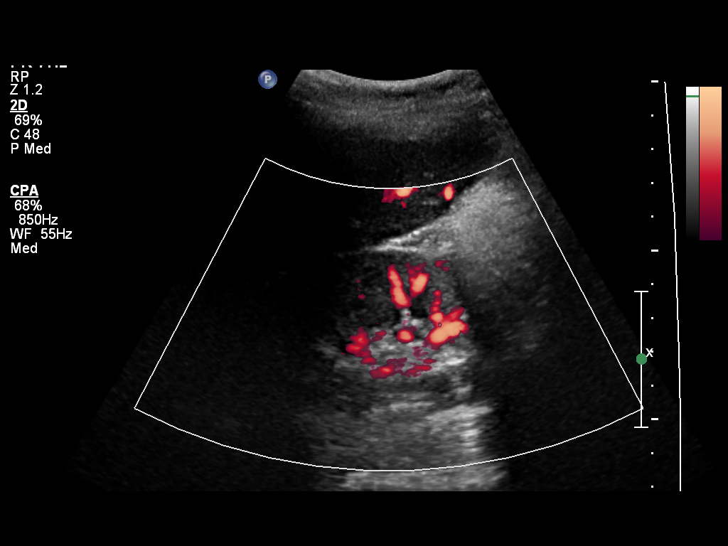
[im 17/37]
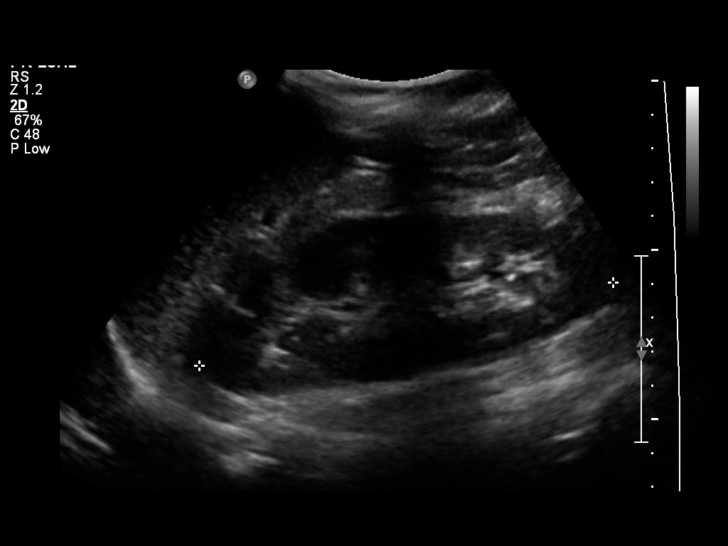
[im 20/37]
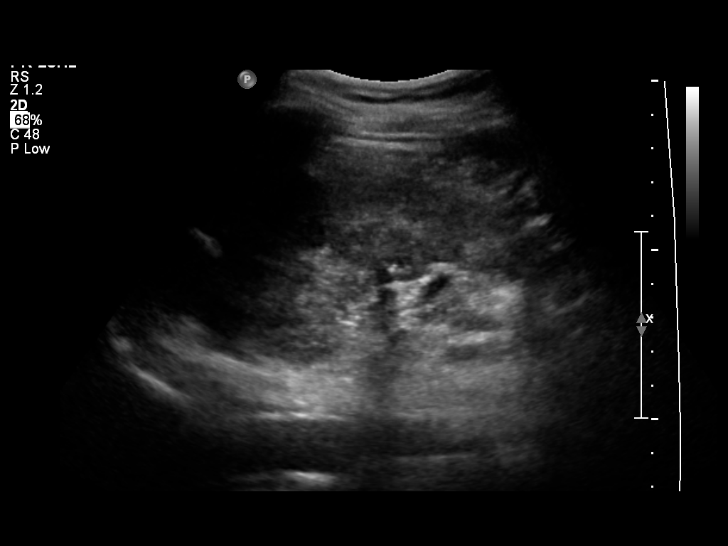
[im 23/37]
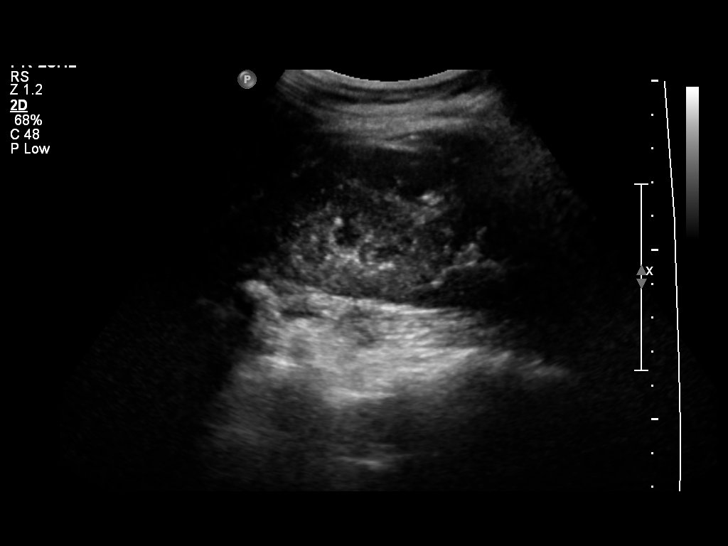
[im 25/37]
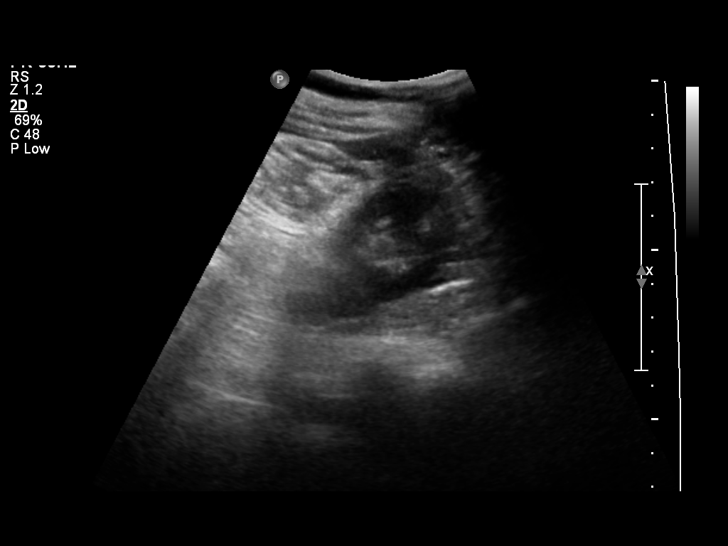
[im 28/37]
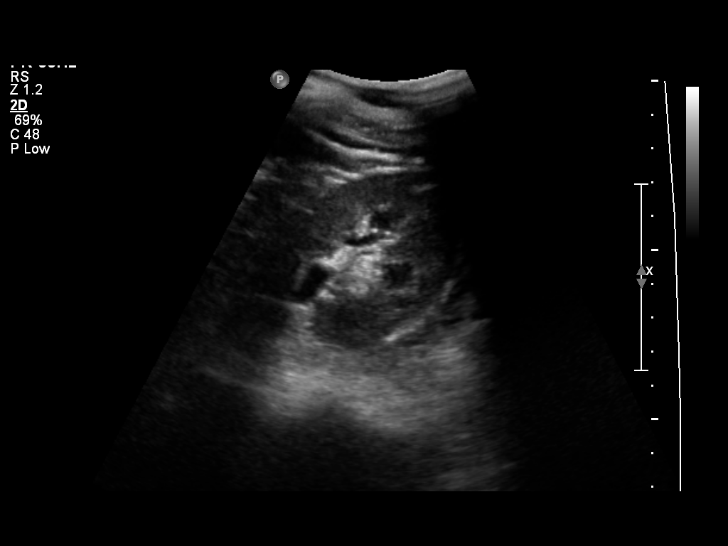
[im 31/37]
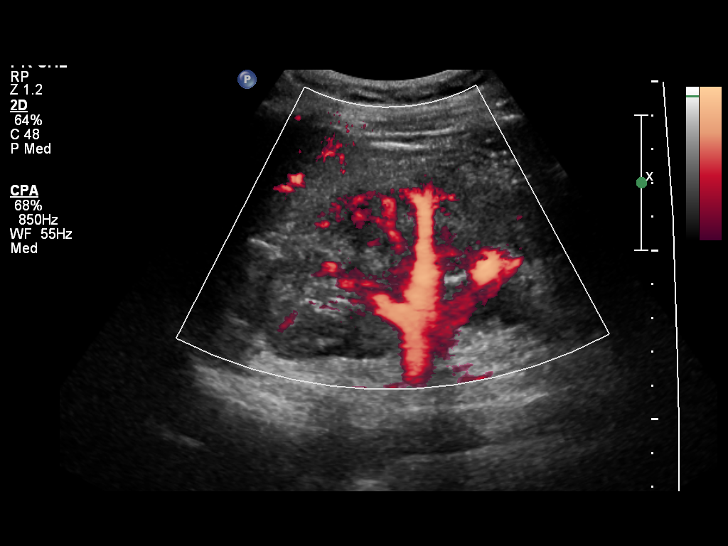
[im 34/37]
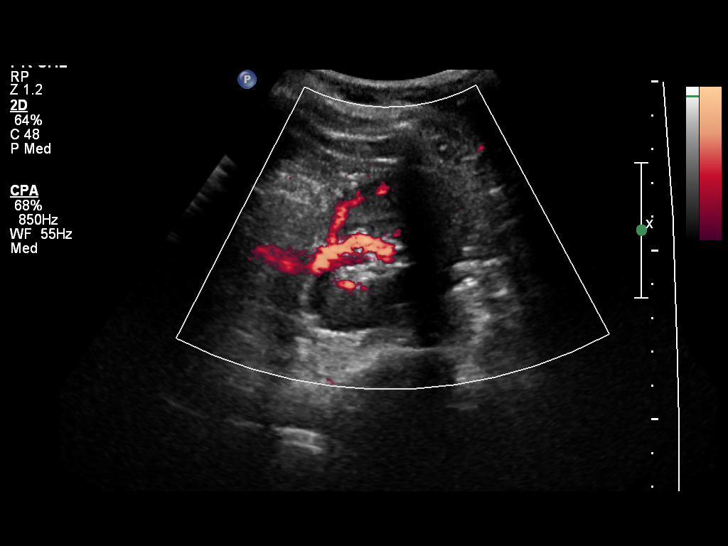
[im 37/37]
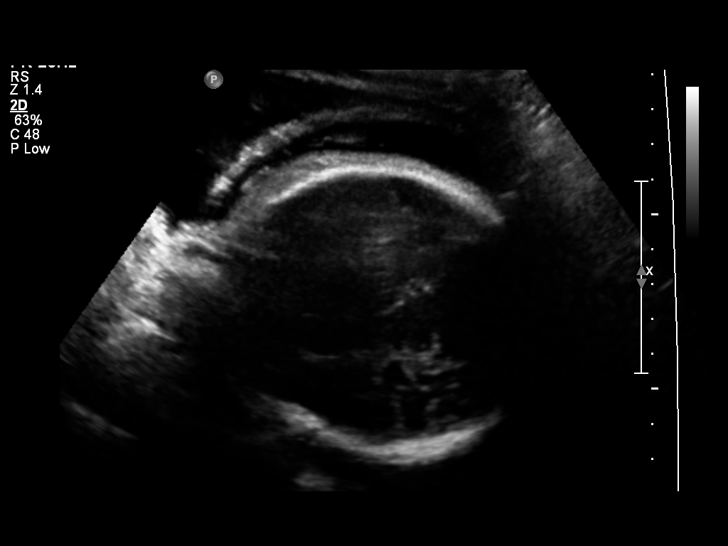

[14 of 25 positions shown; findings below may reference images not displayed]

FINDINGS: Right Kidney:

Length: 11 cm. Echogenicity within normal limits. No mass or
hydronephrosis visualized.

Left Kidney:

Length: 11.7 cm. Echogenicity within normal limits. No mass or
hydronephrosis visualized.

Bladder:

Not distended and therefore not evaluated
IMPRESSION: Kidneys appear normal with no evidence of hydronephrosis

## 2016-02-22 IMAGING — US US MFM OB FOLLOW-UP
1 series · 12 of 28 positions shown · non-contrast
Comparison: none

[Series 1: us mfm ob follow-up · 0.29mm/px · 12 of 28 slices shown]
[im 2/28]
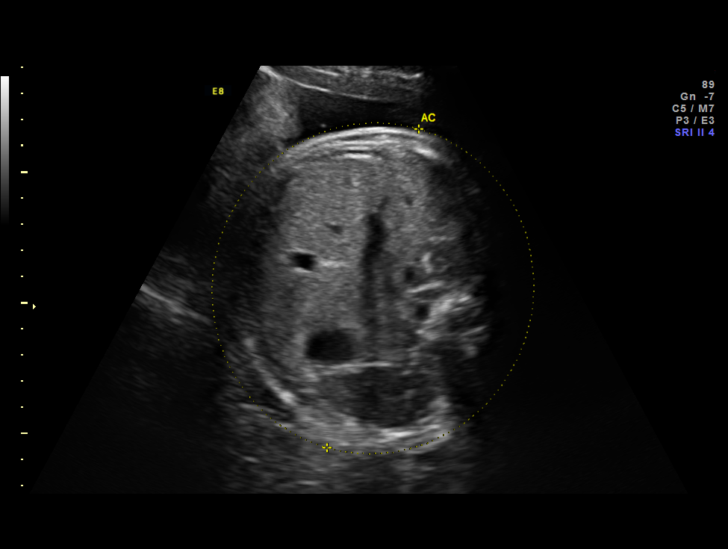
[im 4/28]
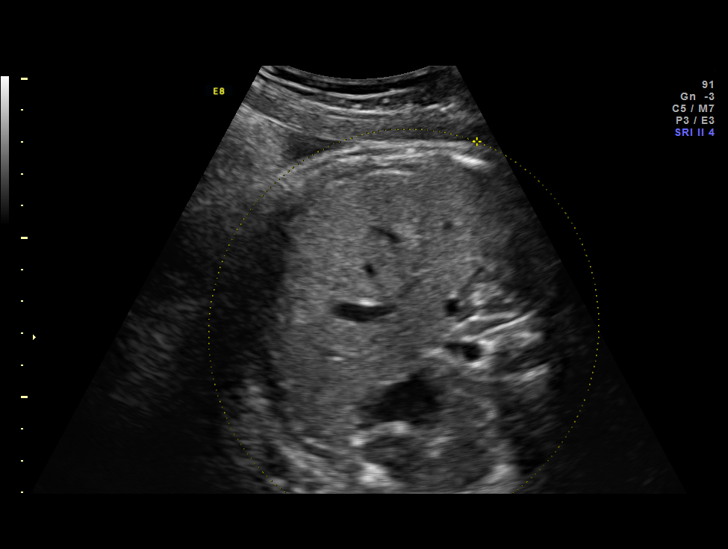
[im 6/28]
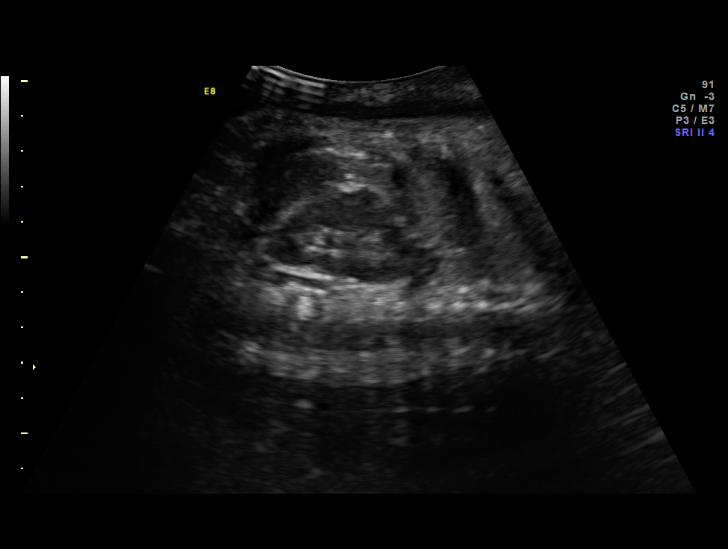
[im 9/28]
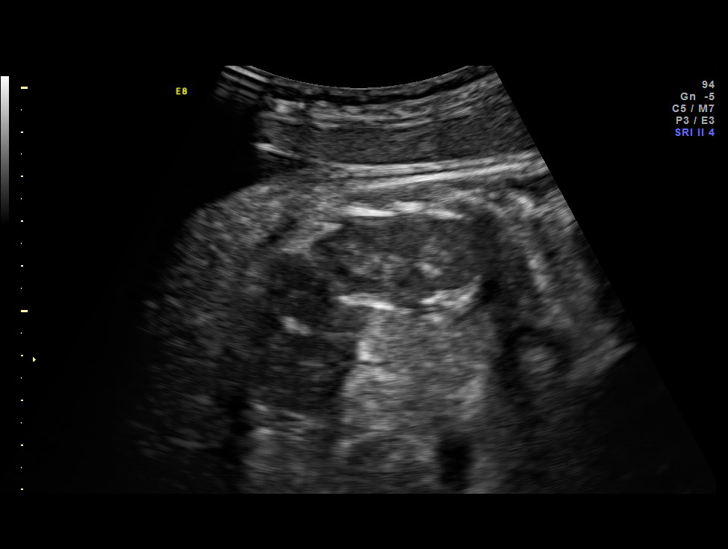
[im 11/28]
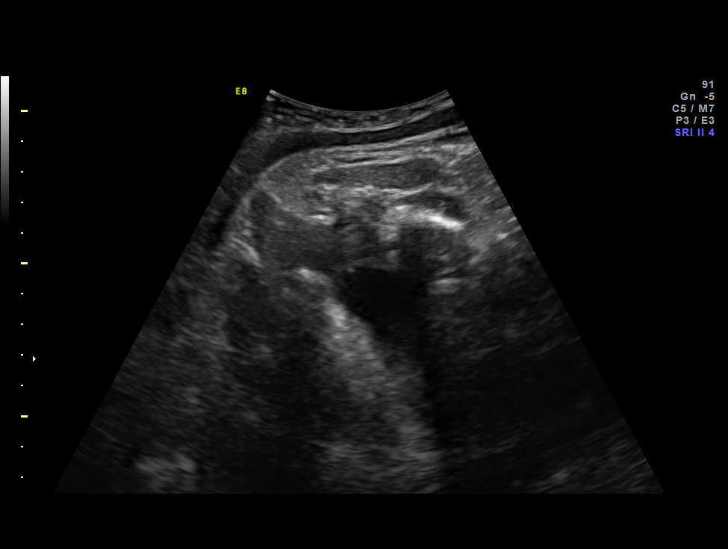
[im 13/28]
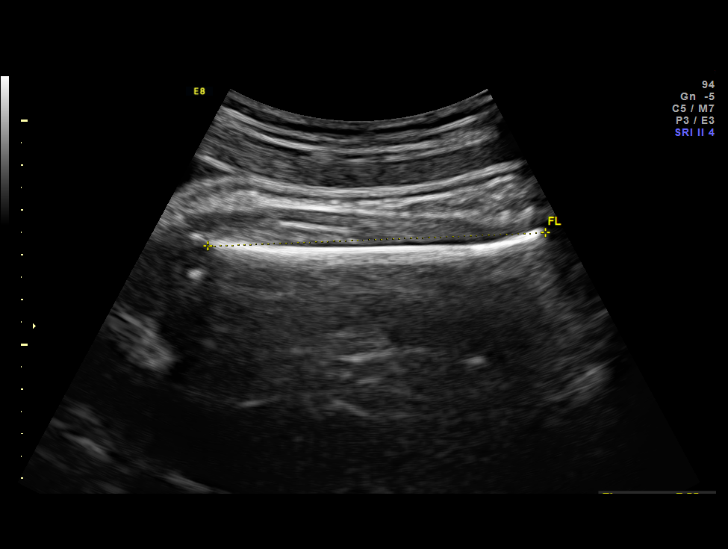
[im 16/28]
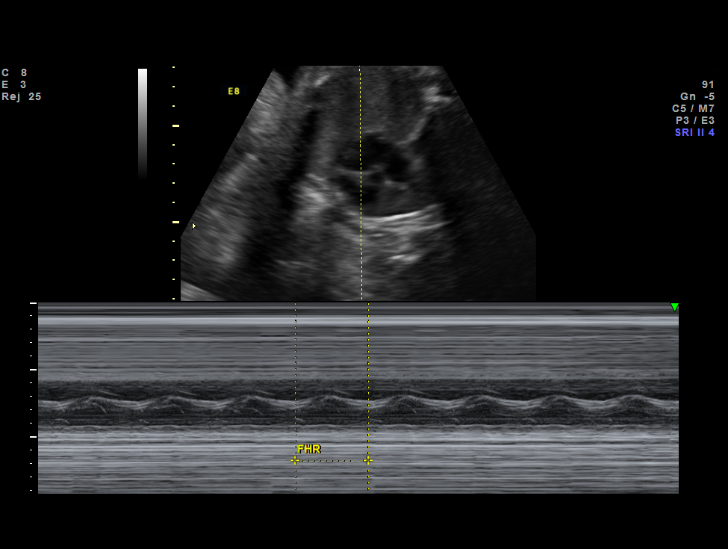
[im 18/28]
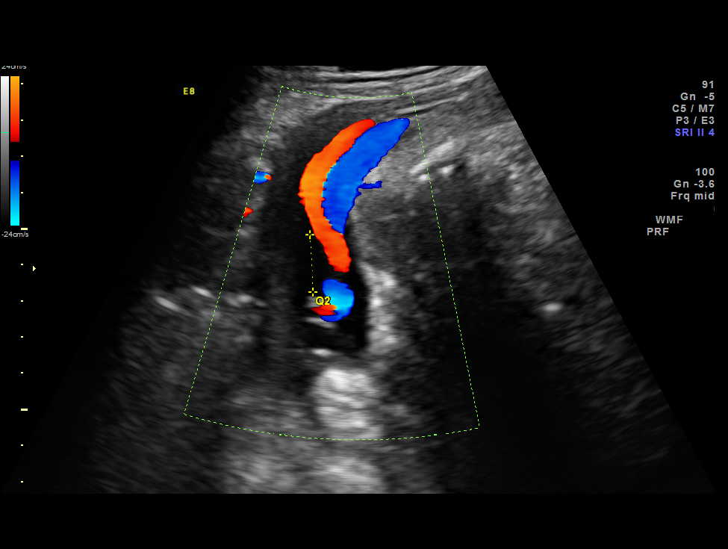
[im 20/28]
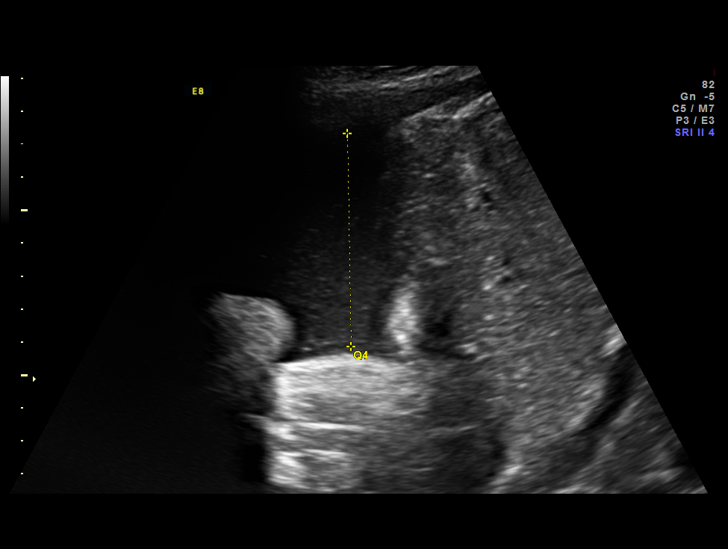
[im 23/28]
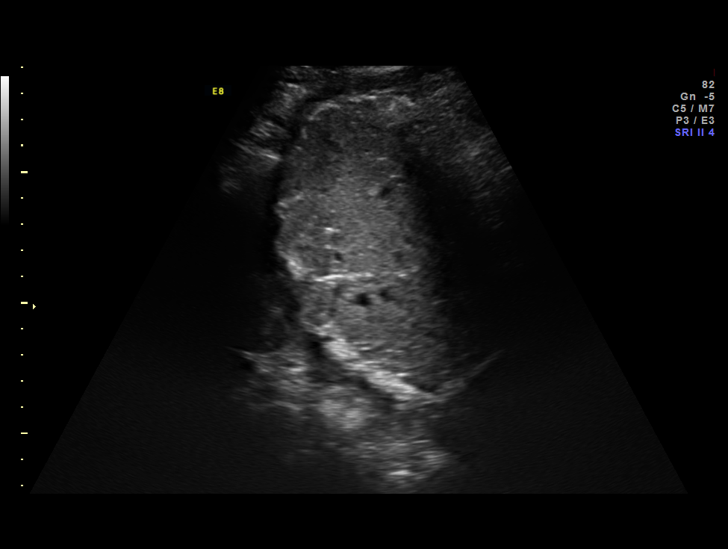
[im 25/28]
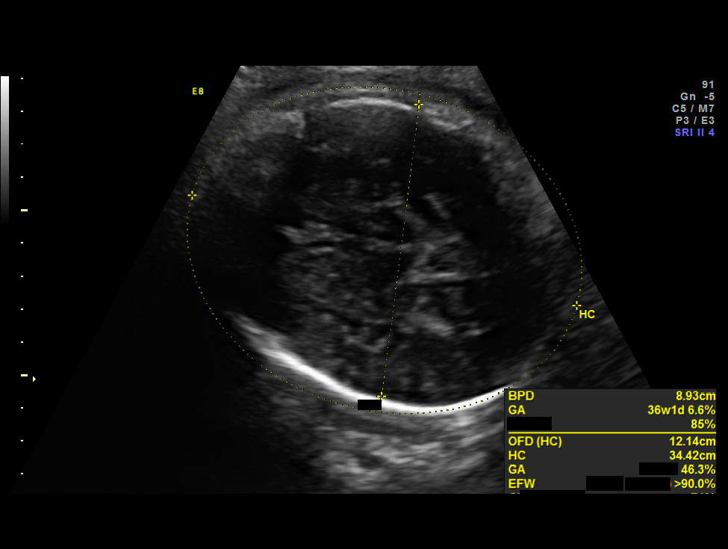
[im 27/28]
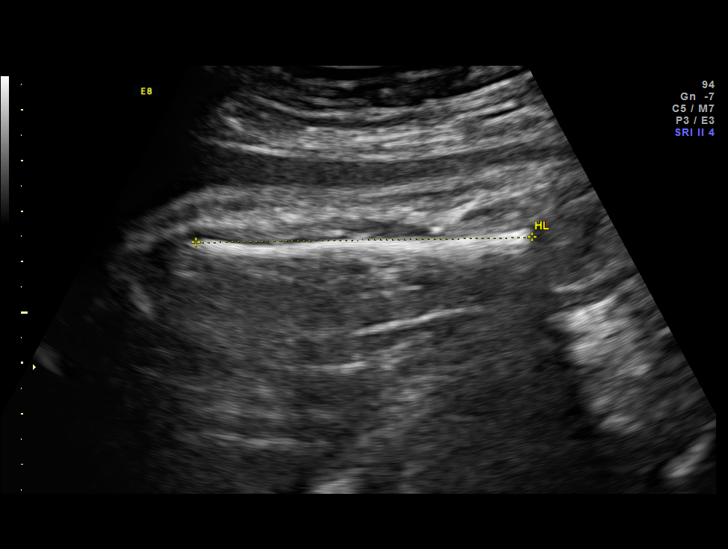

[12 of 28 positions shown; findings below may reference images not displayed]

OBSTETRICS REPORT
(Signed Final 08/15/2015 [DATE])

Service(s) Provided

Indications

39 weeks gestation of pregnancy
Large for gestational age fetus affecting
management of mother
Fetal Evaluation

Num Of Fetuses:    1
Fetal Heart Rate:  138                          bpm
Cardiac Activity:  Observed
Presentation:      Cephalic
Placenta:          Posterior, above cervical
os
P. Cord            Previously Visualized
Insertion:

Amniotic Fluid
AFI FV:      Subjectively within normal limits
AFI Sum:     13.27   cm       54  %Tile     Larg Pckt:    6.47  cm
RUQ:   2.82    cm   RLQ:    6.47   cm    LUQ:   1.59    cm   LLQ:    2.39   cm
Biometry

BPD:     90.9  mm     G. Age:  36w 6d                CI:         75.2   70 - 86
OFD:    120.8  mm                                    FL/HC:      21.8   20.7 -
22.5
HC:     341.6  mm     G. Age:  39w 3d       37  %    HC/AC:      0.88   0.87 -
1.06
AC:       389  mm     G. Age:  N/A        > 97  %    FL/BPD:     81.8   71 - 87
FL:      74.4  mm     G. Age:  38w 1d       21  %    FL/AC:      19.1   20 - 24
HUM:     66.3  mm     G. Age:  38w 3d       57  %

Est. FW:    0518  gm      9 lb 3 oz   > 90  %
Gestational Age

LMP:           42w 2d        Date:  10/23/14                 EDD:   07/30/15
U/S Today:     38w 1d                                        EDD:   08/28/15
Best:          39w 5d     Det. By:  Early Ultrasound         EDD:   08/17/15
(12/21/14)
Anatomy

Cranium:          Previously seen        Aortic Arch:      Previously seen
Fetal Cavum:      Previously seen        Ductal Arch:      Previously seen
Ventricles:       Appears normal         Diaphragm:        Appears normal
Choroid Plexus:   Previously seen        Stomach:          Appears normal, left
sided
Cerebellum:       Previously seen        Abdomen:          Previously seen
Posterior Fossa:  Previously seen        Abdominal Wall:   Previously seen
Nuchal Fold:      Previously seen        Cord Vessels:     Previously seen
Face:             Orbits and profile     Kidneys:          Appear normal
previously seen
Lips:             Previously seen        Bladder:          Appears normal
Heart:            Appears normal         Spine:            Previously seen
(4CH, axis, and
situs)
RVOT:             Previously seen        Lower             Previously seen
Extremities:
LVOT:             Previously seen        Upper             Previously seen
Extremities:

Other:  Female gender. 5th digit previously seen.
Cervix Uterus Adnexa

Cervix:       Not visualized (advanced GA >99wks)
Impression

SIUP at 28w3d
active fetus in cephalic presentation
EFW >90th%'le (6011gm), noting this confers increased risk
for shoulder dystocia
no previa
AFI is normal
Recommendations

Follow up ultrasounds as clinically indicated, noting I
recommend postdates testing if not delivered by EDC.
I would caution against assisted second stage (ie, no forceps
or vacuum).

questions or concerns.

## 2018-02-17 ENCOUNTER — Encounter: Payer: Self-pay | Admitting: *Deleted
